# Patient Record
Sex: Female | Born: 1935 | Race: White | Hispanic: No | Smoking: Never smoker
Health system: Southern US, Community
[De-identification: ages and names within clinical notes are randomized; demographics above are authoritative.]

## PROBLEM LIST (undated history)

## (undated) DIAGNOSIS — K859 Acute pancreatitis without necrosis or infection, unspecified: Secondary | ICD-10-CM

## (undated) DIAGNOSIS — I639 Cerebral infarction, unspecified: Secondary | ICD-10-CM

## (undated) DIAGNOSIS — F0280 Dementia in other diseases classified elsewhere without behavioral disturbance: Secondary | ICD-10-CM

## (undated) DIAGNOSIS — F0150 Vascular dementia without behavioral disturbance: Secondary | ICD-10-CM

## (undated) DIAGNOSIS — G309 Alzheimer's disease, unspecified: Secondary | ICD-10-CM

## (undated) HISTORY — PX: APPENDECTOMY: SHX54

## (undated) HISTORY — PX: ABDOMINAL HYSTERECTOMY: SHX81

## (undated) HISTORY — PX: CHOLECYSTECTOMY: SHX55

---

## 2014-06-29 ENCOUNTER — Emergency Department: Payer: Self-pay | Admitting: Emergency Medicine

## 2014-06-29 LAB — COMPREHENSIVE METABOLIC PANEL
ALBUMIN: 3.8 g/dL (ref 3.4–5.0)
ALK PHOS: 115 U/L
ANION GAP: 11 (ref 7–16)
BILIRUBIN TOTAL: 0.4 mg/dL (ref 0.2–1.0)
BUN: 17 mg/dL (ref 7–18)
CO2: 21 mmol/L (ref 21–32)
Calcium, Total: 8.8 mg/dL (ref 8.5–10.1)
Chloride: 109 mmol/L — ABNORMAL HIGH (ref 98–107)
Creatinine: 1.44 mg/dL — ABNORMAL HIGH (ref 0.60–1.30)
EGFR (African American): 40 — ABNORMAL LOW
EGFR (Non-African Amer.): 35 — ABNORMAL LOW
Glucose: 110 mg/dL — ABNORMAL HIGH (ref 65–99)
Osmolality: 283 (ref 275–301)
Potassium: 3.6 mmol/L (ref 3.5–5.1)
SGOT(AST): 34 U/L (ref 15–37)
SGPT (ALT): 21 U/L
Sodium: 141 mmol/L (ref 136–145)
TOTAL PROTEIN: 7.4 g/dL (ref 6.4–8.2)

## 2014-06-29 LAB — CBC
HCT: 31.3 % — ABNORMAL LOW (ref 35.0–47.0)
HGB: 9.6 g/dL — AB (ref 12.0–16.0)
MCH: 21.6 pg — AB (ref 26.0–34.0)
MCHC: 30.8 g/dL — ABNORMAL LOW (ref 32.0–36.0)
MCV: 70 fL — ABNORMAL LOW (ref 80–100)
Platelet: 223 10*3/uL (ref 150–440)
RBC: 4.47 10*6/uL (ref 3.80–5.20)
RDW: 17.3 % — ABNORMAL HIGH (ref 11.5–14.5)
WBC: 7.2 10*3/uL (ref 3.6–11.0)

## 2014-06-29 LAB — TROPONIN I: Troponin-I: <0.02 ng/mL

## 2014-06-29 LAB — LIPASE, BLOOD: LIPASE: 122 U/L (ref 73–393)

## 2014-10-22 ENCOUNTER — Emergency Department: Payer: Self-pay | Admitting: Emergency Medicine

## 2014-11-26 ENCOUNTER — Emergency Department: Payer: Self-pay | Admitting: Emergency Medicine

## 2014-12-02 ENCOUNTER — Encounter (HOSPITAL_COMMUNITY): Payer: Self-pay | Admitting: Internal Medicine

## 2014-12-02 ENCOUNTER — Inpatient Hospital Stay (HOSPITAL_COMMUNITY): Payer: MEDICARE

## 2014-12-02 ENCOUNTER — Inpatient Hospital Stay (HOSPITAL_COMMUNITY)
Admission: EM | Admit: 2014-12-02 | Discharge: 2014-12-03 | DRG: 440 | Disposition: A | Discharge status: Nursing Home (Includes ICF) | Payer: MEDICARE | Source: Other Acute Inpatient Hospital | Attending: Internal Medicine | Admitting: Internal Medicine

## 2014-12-02 ENCOUNTER — Emergency Department: Payer: Self-pay | Admitting: Emergency Medicine

## 2014-12-02 DIAGNOSIS — Z8673 Personal history of transient ischemic attack (TIA), and cerebral infarction without residual deficits: Secondary | ICD-10-CM

## 2014-12-02 DIAGNOSIS — R748 Abnormal levels of other serum enzymes: Secondary | ICD-10-CM | POA: Diagnosis not present

## 2014-12-02 DIAGNOSIS — F0150 Vascular dementia without behavioral disturbance: Secondary | ICD-10-CM | POA: Diagnosis present

## 2014-12-02 DIAGNOSIS — Z91040 Latex allergy status: Secondary | ICD-10-CM | POA: Diagnosis not present

## 2014-12-02 DIAGNOSIS — F0390 Unspecified dementia without behavioral disturbance: Secondary | ICD-10-CM | POA: Diagnosis present

## 2014-12-02 DIAGNOSIS — I672 Cerebral atherosclerosis: Secondary | ICD-10-CM | POA: Diagnosis present

## 2014-12-02 DIAGNOSIS — K859 Acute pancreatitis without necrosis or infection, unspecified: Secondary | ICD-10-CM | POA: Diagnosis present

## 2014-12-02 DIAGNOSIS — F0391 Unspecified dementia with behavioral disturbance: Secondary | ICD-10-CM | POA: Diagnosis not present

## 2014-12-02 DIAGNOSIS — G309 Alzheimer's disease, unspecified: Secondary | ICD-10-CM | POA: Diagnosis present

## 2014-12-02 DIAGNOSIS — Z9049 Acquired absence of other specified parts of digestive tract: Secondary | ICD-10-CM | POA: Diagnosis not present

## 2014-12-02 DIAGNOSIS — R1013 Epigastric pain: Secondary | ICD-10-CM | POA: Diagnosis present

## 2014-12-02 DIAGNOSIS — F0280 Dementia in other diseases classified elsewhere without behavioral disturbance: Secondary | ICD-10-CM | POA: Diagnosis present

## 2014-12-02 DIAGNOSIS — K850 Idiopathic acute pancreatitis: Secondary | ICD-10-CM

## 2014-12-02 DIAGNOSIS — Z885 Allergy status to narcotic agent status: Secondary | ICD-10-CM

## 2014-12-02 HISTORY — DX: Alzheimer's disease, unspecified: G30.9

## 2014-12-02 HISTORY — DX: Acute pancreatitis without necrosis or infection, unspecified: K85.90

## 2014-12-02 HISTORY — DX: Vascular dementia, unspecified severity, without behavioral disturbance, psychotic disturbance, mood disturbance, and anxiety: F01.50

## 2014-12-02 HISTORY — DX: Cerebral infarction, unspecified: I63.9

## 2014-12-02 HISTORY — DX: Dementia in other diseases classified elsewhere, unspecified severity, without behavioral disturbance, psychotic disturbance, mood disturbance, and anxiety: F02.80

## 2014-12-02 LAB — LIPASE, BLOOD: LIPASE: 30 U/L (ref 11–59)

## 2014-12-02 MED ORDER — HEPARIN SODIUM (PORCINE) 5000 UNIT/ML IJ SOLN
5000.0000 [IU] | Freq: Three times a day (TID) | INTRAMUSCULAR | Status: DC
  Administered 2014-12-02 – 2014-12-03 (×3): 5000 [IU] via SUBCUTANEOUS
  Filled 2014-12-02 (×3): qty 1

## 2014-12-02 MED ORDER — SODIUM CHLORIDE 0.9 % IV SOLN
INTRAVENOUS | Status: DC
  Administered 2014-12-02: 1 mL via INTRAVENOUS

## 2014-12-02 MED ORDER — FERROUS SULFATE 325 (65 FE) MG PO TABS
325.0000 mg | ORAL_TABLET | Freq: Two times a day (BID) | ORAL | Status: DC
  Administered 2014-12-02 – 2014-12-03 (×3): 325 mg via ORAL
  Filled 2014-12-02 (×3): qty 1

## 2014-12-02 MED ORDER — DOCUSATE SODIUM 100 MG PO CAPS
100.0000 mg | ORAL_CAPSULE | Freq: Two times a day (BID) | ORAL | Status: DC
  Administered 2014-12-02 – 2014-12-03 (×3): 100 mg via ORAL
  Filled 2014-12-02 (×3): qty 1

## 2014-12-02 MED ORDER — ATORVASTATIN CALCIUM 20 MG PO TABS
20.0000 mg | ORAL_TABLET | Freq: Every day | ORAL | Status: DC
  Administered 2014-12-02: 20 mg via ORAL
  Filled 2014-12-02: qty 1

## 2014-12-02 MED ORDER — ONDANSETRON HCL 4 MG/2ML IJ SOLN: 4.0000 mg | Freq: Four times a day (QID) | INTRAMUSCULAR | Status: DC | PRN

## 2014-12-02 MED ORDER — MORPHINE SULFATE 2 MG/ML IJ SOLN: 2.0000 mg | INTRAMUSCULAR | Status: DC | PRN

## 2014-12-02 MED ORDER — METOPROLOL SUCCINATE ER 25 MG PO TB24
12.5000 mg | ORAL_TABLET | Freq: Every day | ORAL | Status: DC
  Administered 2014-12-02 – 2014-12-03 (×2): 12.5 mg via ORAL
  Filled 2014-12-02 (×2): qty 1

## 2014-12-02 MED ORDER — DONEPEZIL HCL 10 MG PO TABS
10.0000 mg | ORAL_TABLET | Freq: Every day | ORAL | Status: DC
  Administered 2014-12-02: 10 mg via ORAL
  Filled 2014-12-02: qty 1

## 2014-12-02 MED ORDER — SODIUM CHLORIDE 0.9 % IJ SOLN: 3.0000 mL | Freq: Two times a day (BID) | INTRAMUSCULAR | Status: DC

## 2014-12-02 MED ORDER — CLOPIDOGREL BISULFATE 75 MG PO TABS
75.0000 mg | ORAL_TABLET | Freq: Every day | ORAL | Status: DC
  Administered 2014-12-02 – 2014-12-03 (×2): 75 mg via ORAL
  Filled 2014-12-02 (×2): qty 1

## 2014-12-02 NOTE — H&P (Addendum)
[facility] History and Physical  Taylor Mack [id number]:[id number] DOB: 11-08-1933 DOA: 12/02/2014  Referring physician: EDP PCP: No primary care provider on file.   Chief Complaint: abdominal pain   HPI: Taylor RingsBessie L Hackley is a 81 y.o. female who presents [facility] the ED at [facility] with abdominal pain, N/V.  Symptoms onset earlier this evening.  Pain is severe, located in her epigastric area, she has tried nothing for symptoms at this time.  Seen 11/26/14 with similar symptoms but negative CT and work up at that time per report (lipase was 257 at that time which is WNL for [facility] labs).  Today work up reveals elevated lipase of over 1400.  Review of Systems: Systems reviewed.  As above, otherwise negative  Past Medical History  Diagnosis Date  . Alzheimer's dementia   . Stroke   . Vascular dementia    Past Surgical History  Procedure Laterality Date  . Cholecystectomy     Social History:  reports that she does not drink alcohol or use illicit drugs. Her tobacco history is not on file.  Allergies  Allergen Reactions  . Codeine   . Latex     No family history on file.   Prior to Admission medications   Not on File   Physical Exam: Filed Vitals:   12/02/14 0525  BP: 97/50  Pulse: 84  Temp: 98.8 F (37.1 C)  Resp: 17    BP 97/50 mmHg  Pulse 84  Temp(Src) 98.8 F (37.1 C) (Oral)  Resp 17  Ht 5\' 3"  (1.6 m)  Wt 72.077 kg (158 lb 14.4 oz)  BMI 28.16 kg/m2  SpO2 93%  General Appearance:    Alert, oriented, no distress, appears stated age  Head:    Normocephalic, atraumatic  Eyes:    PERRL, EOMI, sclera non-icteric        Nose:   Nares without drainage or epistaxis. Mucosa, turbinates normal  Throat:   Moist mucous membranes. Oropharynx without erythema or exudate.  Neck:   Supple. No carotid bruits.  No thyromegaly.  No lymphadenopathy.   Back:     No CVA tenderness, no spinal tenderness  Lungs:     Clear to auscultation bilaterally, without wheezes, rhonchi or rales  Chest  wall:    No tenderness to palpitation  Heart:    Regular rate and rhythm without murmurs, gallops, rubs  Abdomen:     Soft, Epigastric tenderness nondistended, normal bowel sounds, no organomegaly  Genitalia:    deferred  Rectal:    deferred  Extremities:   No clubbing, cyanosis or edema.  Pulses:   2+ and symmetric all extremities  Skin:   Skin color, texture, turgor normal, no rashes or lesions  Lymph nodes:   Cervical, supraclavicular, and axillary nodes normal  Neurologic:   CNII-XII intact. Normal strength, sensation and reflexes      throughout    Labs on Admission:  Basic Metabolic Panel: No results for input(s): NA, K, CL, CO2, GLUCOSE, BUN, CREATININE, CALCIUM, MG, PHOS in the last 168 hours. Liver Function Tests: No results for input(s): AST, ALT, ALKPHOS, BILITOT, PROT, ALBUMIN in the last 168 hours. No results for input(s): LIPASE, AMYLASE in the last 168 hours. No results for input(s): AMMONIA in the last 168 hours. CBC: No results for input(s): WBC, NEUTROABS, HGB, HCT, MCV, PLT in the last 168 hours. Cardiac Enzymes: No results for input(s): CKTOTAL, CKMB, CKMBINDEX, TROPONINI in the last 168 hours.  BNP (last 3 results) No results for  input(s): PROBNP in the last 8760 hours. CBG: No results for input(s): GLUCAP in the last 168 hours.  Radiological Exams on Admission: No results found.  EKG: Independently reviewed.  Assessment/Plan Principal Problem:   Pancreatitis, acute Active Problems:   Dementia   History of stroke   1. Acute pancreatitis - no EtOH use, idiopathic at this point 1. US abdomen ordered, has CBD dilation on CT scan but this may just be normal as she has had a cholecystectomy in the past, no obvious evidence of stone on CT scan. 2. IVF 3. NPO for bowel rest 4. Pain control with morphine, pain was controlled at [facility] after 0.5mg  dilaudid, patient now sleeping comfortably 5. Repeat CMP tomorrow 2. Dementia - continue home meds 3. H/o  stroke - continue plavix, statin 4. DVT ppx with heparin     Code Status: Full Code  Family Communication: Son at bedside Disposition Plan: Admit to inpatient   Time spent: 70 min  GARDNER, JARED M. [facility] Pager [phone]  If 7AM-7PM, please contact the day team taking care of the patient [email or web address] Password [facility] 12/02/2014, 6:22 AM

## 2014-12-02 NOTE — Progress Notes (Signed)
INITIAL NUTRITION ASSESSMENT  DOCUMENTATION CODES Per approved criteria  -Not Applicable   INTERVENTION: -RD to follow for diet advancement -Supplement diet as appropriate  NUTRITION DIAGNOSIS: Inadequate oral intake related to altered GI function as evidenced by NPO.   Goal: Pt will meet >90% of estimated nutritional needs  Monitor:  Diet advancement, PO intake, labs, weight changes, I/O's  Reason for Assessment: MST=2  81 y.o. female  Admitting Dx: Pancreatitis, acute  Taylor Mack is a 81 y.o. female who [facility] ED [facility] with abdominal pain, N/V. Symptoms onset earlier this evening. Pain is severe, located in her epigastric area, she has tried nothing for symptoms at this time. Seen 11/26/14 with similar symptoms but negative CT and work up at that time per report (lipase was 257 at that time which is WNL for [facility] labs). Today work up reveals elevated lipase of over 1400.  ASSESSMENT: Pt with dementia, so hx is likely unreliable. She reports good appetite and no weight loss PTA. She has no problems chewing or swallowing foods and liquids. She also denies no changes in her mobility PTA. Spoke with nursing student, who reports that pt has been trying to get out of bed.  Pt is currently NPO for bowel rest due to pancreatitis.  Nutrition-focused physical exam reveals no signs of fat or muscle depletion. No labs available to review at this time.   Height: Ht Readings from Last 1 Encounters:  12/02/14 5\' 3"  (1.6 m)    Weight: Wt Readings from Last 1 Encounters:  12/02/14 158 lb 14.4 oz (72.077 kg)    Ideal Body Weight: 115#  % Ideal Body Weight: 137%  Wt Readings from Last 10 Encounters:  12/02/14 158 lb 14.4 oz (72.077 kg)    Usual Body Weight: 158#  % Usual Body Weight: 100%  BMI:  Body mass index is 28.16 kg/(m^2).  Estimated Nutritional Needs: Kcal: 1600-1800 Protein: 65-75 grams Fluid: 1.6-1.8 L  Skin: WDL  Diet Order: Diet NPO time  specified Except for: Sips with Meds  EDUCATION NEEDS: -Education not appropriate at this time  No intake or output data in the 24 hours ending 12/02/14 1037  Last BM: 12/02/14  Labs:  No results for input(s): NA, K, CL, CO2, BUN, CREATININE, CALCIUM, MG, PHOS, GLUCOSE in the last 168 hours.  CBG (last 3)  No results for input(s): GLUCAP in the last 72 hours.  Scheduled Meds: . atorvastatin  20 mg Oral q1800  . clopidogrel  75 mg Oral Daily  . docusate sodium  100 mg Oral BID  . donepezil  10 mg Oral QHS  . ferrous sulfate  325 mg Oral BID WC  . heparin  5,000 Units Subcutaneous 3 times per day  . metoprolol succinate  12.5 mg Oral Daily  . sodium chloride  3 mL Intravenous Q12H    Continuous Infusions: . sodium chloride 1 mL (12/02/14 0532)    Past Medical History  Diagnosis Date  . Alzheimer's dementia   . Stroke   . Vascular dementia     Past Surgical History  Procedure Laterality Date  . Cholecystectomy      Taylor Mack A. Mayford KnifeWilliams, RD, LDN, CDE Pager: [phone] After hours Pager: [phone]

## 2014-12-02 NOTE — Clinical Social Work Psychosocial (Signed)
Clinical Social Work Department BRIEF PSYCHOSOCIAL ASSESSMENT 12/02/2014  Patient:  Taylor Mack,Taylor Mack     Account Number:  [id number]     Admit date:  12/02/2014  Clinical Social Worker:  Elouise MunroeANTERHAUS,Daisean Brodhead, LCSWA  Date/Time:  12/02/2014 04:47 PM  Referred by:  Physician  Date Referred:  12/02/2014 Referred for  ALF Placement   Other Referral:   Interview type:  Family Other interview type:    PSYCHOSOCIAL DATA Living Status:  FACILITY Admitted from facility:  Other Level of care:  Assisted Living Primary support name:  [name] Primary support relationship to patient:  FAMILY Degree of support available:   Patient lives in an ALF facility called [facility] in [place].  Staff provide care for patient, while son is the main contact due to patient's dementia.    CURRENT CONCERNS Current Concerns  Post-Acute Placement   Other Concerns:    SOCIAL WORK ASSESSMENT / PLAN Patient is a 81 year old female who lives at [facility] and is private pay.  Patient has dementia and is only oriented to person.  Contacted patient's son [name] to discuss placement after hospital.  Patient and son would like to return to the ALF and maybe eventually move to the memory care but currently patient's son says she is doing alright in the regular ALF.  Patient is confused at times, but able to converse when talked to if she wants to. Patient's son has been very pleased with the staff at the ALF and said CSW can contact Laverne if there are any other questions.  Patient's plan is to return to [facility] once she is medically ready and discharge orders have been received.   Assessment/plan status:  Psychosocial Support/Ongoing Assessment of Needs Other assessment/ plan:   Information/referral to community resources:    PATIENT'S/FAMILY'S RESPONSE TO PLAN OF CARE: Patient and son agreeable to returning to ALF once she is ready for discharge.   Ervin KnackEric R. Midori Dado, MSW,  Theresia MajorsLCSWA [phone] 12/02/2014 4:55 PM

## 2014-12-02 NOTE — Clinical Social Work Note (Addendum)
Received phone call from [facility] in [place] saying that patient is from Assisted Living and [facility]'s [facility].  Contacted patient's son to confirm where patient is living, and son [name] said she is in the ALF and not in memory care, but in the future she may move to memory care.  Currently the plan is to return to the ALF, and not the memory care unit, patient's son said Clide CliffLaverne is the contact person at [facility], and she is very helpful if there are any questions.  Ervin KnackEric R. Camree Wigington, MSW, LCSWA [phone] 12/02/2014 11:34 AM

## 2014-12-02 NOTE — Progress Notes (Signed)
[facility] Progress Note   Taylor RingsBessie L Dysart [id number] DOB: May 26, 1934 DOA: 12/02/2014 PCP: No primary care provider on file.  Brief narrative: Taylor Mack is a 81 y.o. female with dementia who [facility]. Her Lipase was noted to be > 1400 (upper limit of normal 300) and she was therefore admitted for acute pancreatitis. She was transferred to [facility].    Subjective: Patient confused (dementia) - no c/o pain or vomiting  Assessment/Plan: Principal Problem:   Pancreatitis, acute - lipase re-checked here and found to be normal- ultrasound abdomen does not show evidence of acute pancreatitis today - will start clears liquids and advance to full liquids today if she tolerates them  Active Problems:   Dementia - cont to monitor   Code Status: full code Family Communication:  Disposition Plan: home tomorrow if tolerating solids DVT prophylaxis: Heparin  Consultants:   Procedures:   Antibiotics: Anti-infectives    None         Objective: Filed Weights   12/02/14 0525  Weight: 72.077 kg (158 lb 14.4 oz)   No intake or output data in the 24 hours ending 12/02/14 1548   Vitals Filed Vitals:   12/02/14 0525 12/02/14 1014  BP: 97/50   Pulse: 84 65  Temp: 98.8 F (37.1 C)   TempSrc: Oral   Resp: 17   Height: 5\' 3"  (1.6 m)   Weight: 72.077 kg (158 lb 14.4 oz)   SpO2: 93%     Exam: General: alert, confused, No acute respiratory distress Lungs: Clear to auscultation bilaterally without wheezes or crackles Cardiovascular: Regular rate and rhythm without murmur gallop or rub normal S1 and S2 Abdomen: tender in periumbilical area, nondistended, soft, bowel sounds positive, no rebound, no ascites, no appreciable mass Extremities: No significant cyanosis, clubbing, or edema bilateral lower extremities  Data Reviewed: Basic Metabolic Panel: No results for input(s): NA, K, CL, CO2, GLUCOSE, BUN, CREATININE,  CALCIUM, MG, PHOS in the last 168 hours. Liver Function Tests: No results for input(s): AST, ALT, ALKPHOS, BILITOT, PROT, ALBUMIN in the last 168 hours.  Recent Labs Lab 12/02/14 1111  LIPASE 30   No results for input(s): AMMONIA in the last 168 hours. CBC: No results for input(s): WBC, NEUTROABS, HGB, HCT, MCV, PLT in the last 168 hours. Cardiac Enzymes: No results for input(s): CKTOTAL, CKMB, CKMBINDEX, TROPONINI in the last 168 hours. BNP (last 3 results) No results for input(s): BNP in the last 8760 hours.  ProBNP (last 3 results) No results for input(s): PROBNP in the last 8760 hours.  CBG: No results for input(s): GLUCAP in the last 168 hours.  No results found for this or any previous visit (from the past 240 hour(s)).   Studies:  Recent x-ray studies have been reviewed in detail by the Attending Physician  Scheduled Meds:  Scheduled Meds: . atorvastatin  20 mg Oral q1800  . clopidogrel  75 mg Oral Daily  . docusate sodium  100 mg Oral BID  . donepezil  10 mg Oral QHS  . ferrous sulfate  325 mg Oral BID WC  . heparin  5,000 Units Subcutaneous 3 times per day  . metoprolol succinate  12.5 mg Oral Daily  . sodium chloride  3 mL Intravenous Q12H   Continuous Infusions:   Time spent on care of this patient: 35 min   Herberth Deharo, MD 12/02/2014, 3:48 PM  LOS: 0 days   [facility] Office  [phone] Pager -  Text Page per [email or web address]  If 7PM-7AM, please contact night-coverage [email or web address]

## 2014-12-02 NOTE — Progress Notes (Signed)
New admit came from [facility] chief complaint of abd pain due to pancreatitis, alert and oriented with left AC PIV intact, accompanied by her son, started IVF with NSS at 125cc/hr, on NPO will continue to monitor, no s/s of nausea/vomiting and abd pain at this time.f

## 2014-12-03 ENCOUNTER — Encounter (HOSPITAL_COMMUNITY): Payer: Self-pay | Admitting: General Practice

## 2014-12-03 DIAGNOSIS — K859 Acute pancreatitis, unspecified: Principal | ICD-10-CM

## 2014-12-03 LAB — COMPREHENSIVE METABOLIC PANEL
ALT: 14 U/L (ref 0–35)
AST: 24 U/L (ref 0–37)
Albumin: 3.4 g/dL — ABNORMAL LOW (ref 3.5–5.2)
Alkaline Phosphatase: 78 U/L (ref 39–117)
Anion gap: 4 — ABNORMAL LOW (ref 5–15)
BILIRUBIN TOTAL: 0.8 mg/dL (ref 0.3–1.2)
BUN: 9 mg/dL (ref 6–23)
CO2: 29 mmol/L (ref 19–32)
CREATININE: 1.17 mg/dL — AB (ref 0.50–1.10)
Calcium: 8.7 mg/dL (ref 8.4–10.5)
Chloride: 106 mmol/L (ref 96–112)
GFR calc Af Amer: 50 mL/min — ABNORMAL LOW (ref >90)
GFR calc non Af Amer: 43 mL/min — ABNORMAL LOW (ref >90)
GLUCOSE: 96 mg/dL (ref 70–99)
POTASSIUM: 3.8 mmol/L (ref 3.5–5.1)
Sodium: 139 mmol/L (ref 135–145)
Total Protein: 6 g/dL (ref 6.0–8.3)

## 2014-12-03 LAB — CBC
HCT: 34.8 % — ABNORMAL LOW (ref 36.0–46.0)
Hemoglobin: 10.6 g/dL — ABNORMAL LOW (ref 12.0–15.0)
MCH: 24 pg — ABNORMAL LOW (ref 26.0–34.0)
MCHC: 30.5 g/dL (ref 30.0–36.0)
MCV: 78.7 fL (ref 78.0–100.0)
Platelets: 180 10*3/uL (ref 150–400)
RBC: 4.42 MIL/uL (ref 3.87–5.11)
RDW: 22.5 % — AB (ref 11.5–15.5)
WBC: 5.1 10*3/uL (ref 4.0–10.5)

## 2014-12-03 NOTE — Clinical Social Work Note (Signed)
Patient to be d/c'ed today to [facility] in [place], patient and family agreeable to plans will transport via patient's son RN to call report.  Patient's FL2 and discharge summary faxed to ALF.  Windell MouldingEric Habiba Treloar, MSW, Theresia MajorsLCSWA [phone]

## 2014-12-03 NOTE — Progress Notes (Signed)
Discharge to [facility] [place], transported by [employer]. Discharge packet given to Son, no questions verbalized.

## 2014-12-03 NOTE — Discharge Summary (Signed)
Physician Discharge Summary  Taylor Mack [id number] DOB: 1934-07-20 DOA: 12/02/2014  PCP: No primary care provider on file.  Admit date: 12/02/2014 Discharge date: 12/03/2014  Time spent: 45 minutes    Discharge Condition: Stable Diet recommendation: Heart healthy diet  Discharge Diagnoses:  Principal Problem:   Pancreatitis, acute Active Problems:   Dementia   History of stroke      History of present illness:  Taylor Mack is a 81 y.o. female with dementia who [facility], nausea and vomiting. Since the patient was confused it was difficult to obtain a history from her. Her Lipase was noted to be > 1400 (upper limit of normal 300) and therefore it was decided to admit her for for acute pancreatitis. She was [facility] to be [facility].    Hospital Course:  Principal Problem:  Pancreatitis, acute - lipase re-checked here on 2/22 and found to be normal- ultrasound abdomen on 2/22 does not show evidence of acute pancreatitis  - she was started clears liquids yesterday and advance to a solid diet- no recurrence of vomiting and no complaints of abdominal pain noted   Active Problems:  Dementia -She has significant memory deficits but has not had any significant behavioral disturbances - The patient lives in assisted living facility where she will be returning to today   Discharge Exam: Filed Weights   12/02/14 0525  Weight: 72.077 kg (158 lb 14.4 oz)   Filed Vitals:   12/03/14 0924  BP:   Pulse: 64  Temp:   Resp:     General: AAO x 3, no distress Cardiovascular: RRR, no murmurs  Respiratory: clear to auscultation bilaterally GI: soft, non-tender, non-distended, bowel sound positive  Discharge Instructions You were cared for by a hospitalist during your hospital stay. If you have any questions about your discharge medications or the care you received while you were in the hospital after you are  discharged, you can call the unit and asked to speak with the hospitalist on call if the hospitalist that took care of you is not available. Once you are discharged, your primary care physician will handle any further medical issues. Please note that NO REFILLS for any discharge medications will be authorized once you are discharged, as it is imperative that you return to your primary care physician (or establish a relationship with a primary care physician if you do not have one) for your aftercare needs so that they can reassess your need for medications and monitor your lab values.  Discharge Instructions    Diet - low sodium heart healthy    Complete by:  As directed      Increase activity slowly    Complete by:  As directed             Medication List    TAKE these medications        acetaminophen 500 MG tablet  Commonly known as:  TYLENOL  Take 500 mg by mouth every 4 (four) hours as needed for fever.     atorvastatin 20 MG tablet  Commonly known as:  LIPITOR  Take 20 mg by mouth daily.     BAZA PROTECT EX  Apply 1 application topically 3 (three) times daily as needed (incontinence).     calcium-vitamin D 500-200 MG-UNIT per tablet  Commonly known as:  OSCAL WITH D  Take 0.5 tablets by mouth daily.     clopidogrel 75 MG tablet  Commonly known as:  PLAVIX  Take 75 mg by mouth daily.     diclofenac sodium 1 % Gel  Commonly known as:  VOLTAREN  Apply 2 g topically 2 (two) times daily. For 14 days     docusate sodium 100 MG capsule  Commonly known as:  COLACE  Take 100 mg by mouth 2 (two) times daily.     donepezil 10 MG tablet  Commonly known as:  ARICEPT  Take 10 mg by mouth at bedtime.     escitalopram 20 MG tablet  Commonly known as:  LEXAPRO  Take 20 mg by mouth daily.     ferrous sulfate 325 (65 FE) MG tablet  Take 325 mg by mouth 2 (two) times daily with a meal.     guaifenesin 100 MG/5ML syrup  Commonly known as:  ROBITUSSIN  Take 200 mg by mouth 3  (three) times daily as needed for cough.     loperamide 2 MG capsule  Commonly known as:  IMODIUM  Take 2 mg by mouth every 3 (three) hours as needed for diarrhea or loose stools.     metoprolol succinate 25 MG 24 hr tablet  Commonly known as:  TOPROL-XL  Take 12.5 mg by mouth daily.     neomycin-bacitracin-polymyxin ointment  Commonly known as:  NEOSPORIN  Apply 1 application topically 2 (two) times daily as needed for wound care. apply to eye     NONFORMULARY OR COMPOUNDED ITEM  Apply 1 application topically 2 (two) times daily as needed (itching). Triamcinolone/Minerin 1:1 cream     omeprazole 20 MG capsule  Commonly known as:  PRILOSEC  Take 20 mg by mouth daily.     Vitamin D (Ergocalciferol) 50000 UNITS Caps capsule  Commonly known as:  DRISDOL  Take 50,000 Units by mouth every 7 (seven) days.       Allergies  Allergen Reactions  . Codeine Nausea And Vomiting  . Latex Other (See Comments)    unknown      The results of significant diagnostics from this hospitalization (including imaging, microbiology, ancillary and laboratory) are listed below for reference.    Significant Diagnostic Studies: US Abdomen Complete  12/02/2014   CLINICAL DATA:  Acute pancreatitis.  History of cholecystectomy.  EXAM: ULTRASOUND ABDOMEN COMPLETE  COMPARISON:  Abdominal CT 11/26/2014  FINDINGS: Gallbladder: Surgically removed.  Common bile duct: Diameter: 0.8 cm and stable from recent CT.  Liver: No focal lesion identified. Within normal limits in parenchymal echogenicity.  IVC: No abnormality visualized.  Pancreas: The pancreatic head and body are hyperechoic without significant duct dilatation. The pancreatic tail is not visualized.  Spleen: Size and appearance within normal limits.  Right Kidney: Length: 9.8 cm. Echogenicity within normal limits. No mass or hydronephrosis visualized.  Left Kidney: Length: 11.0 cm. Echogenicity within normal limits. No mass or hydronephrosis visualized.   Abdominal aorta: No aneurysm visualized.  Limited evaluation.  Other findings: None.  IMPRESSION: No acute abnormality.  Post cholecystectomy. Mild dilatation of the common bile duct is within normal limits for age and post cholecystectomy. No significant change from recent abdominal CT.   Electronically Signed   By: Richarda Overlie M.D.   On: 12/02/2014 10:37    Microbiology: No results found for this or any previous visit (from the past 240 hour(s)).   Labs: Basic Metabolic Panel:  Recent Labs Lab 12/03/14 0523  NA 139  K 3.8  CL 106  CO2 29  GLUCOSE 96  BUN 9  CREATININE  1.17*  CALCIUM 8.7   Liver Function Tests:  Recent Labs Lab 12/03/14 0523  AST 24  ALT 14  ALKPHOS 78  BILITOT 0.8  PROT 6.0  ALBUMIN 3.4*    Recent Labs Lab 12/02/14 1111  LIPASE 30   No results for input(s): AMMONIA in the last 168 hours. CBC:  Recent Labs Lab 12/03/14 0523  WBC 5.1  HGB 10.6*  HCT 34.8*  MCV 78.7  PLT 180   Cardiac Enzymes: No results for input(s): CKTOTAL, CKMB, CKMBINDEX, TROPONINI in the last 168 hours. BNP: BNP (last 3 results) No results for input(s): BNP in the last 8760 hours.  ProBNP (last 3 results) No results for input(s): PROBNP in the last 8760 hours.  CBG: No results for input(s): GLUCAP in the last 168 hours.     SignedCalvert Cantor, MD [facility] 12/03/2014, 11:55 AM

## 2014-12-03 NOTE — Care Management (Signed)
12-03-14 Important message from Medicare given to patient . Jceon Alverio RN BSN         

## 2015-09-13 ENCOUNTER — Emergency Department
Admission: EM | Admit: 2015-09-13 | Discharge: 2015-09-14 | Disposition: A | Discharge status: Home / Self Care | Payer: MEDICARE | Attending: Emergency Medicine | Admitting: Emergency Medicine

## 2015-09-13 ENCOUNTER — Encounter: Payer: Self-pay | Admitting: Emergency Medicine

## 2015-09-13 DIAGNOSIS — Z791 Long term (current) use of non-steroidal anti-inflammatories (NSAID): Secondary | ICD-10-CM | POA: Insufficient documentation

## 2015-09-13 DIAGNOSIS — G309 Alzheimer's disease, unspecified: Secondary | ICD-10-CM | POA: Diagnosis not present

## 2015-09-13 DIAGNOSIS — Z79899 Other long term (current) drug therapy: Secondary | ICD-10-CM | POA: Diagnosis not present

## 2015-09-13 DIAGNOSIS — J00 Acute nasopharyngitis [common cold]: Secondary | ICD-10-CM | POA: Diagnosis not present

## 2015-09-13 DIAGNOSIS — Z91040 Latex allergy status: Secondary | ICD-10-CM | POA: Insufficient documentation

## 2015-09-13 DIAGNOSIS — N390 Urinary tract infection, site not specified: Secondary | ICD-10-CM | POA: Insufficient documentation

## 2015-09-13 DIAGNOSIS — R451 Restlessness and agitation: Secondary | ICD-10-CM

## 2015-09-13 DIAGNOSIS — F0281 Dementia in other diseases classified elsewhere with behavioral disturbance: Secondary | ICD-10-CM | POA: Insufficient documentation

## 2015-09-13 DIAGNOSIS — F0391 Unspecified dementia with behavioral disturbance: Secondary | ICD-10-CM

## 2015-09-13 LAB — COMPREHENSIVE METABOLIC PANEL
ALT: 17 U/L (ref 14–54)
AST: 27 U/L (ref 15–41)
Albumin: 3.9 g/dL (ref 3.5–5.0)
Alkaline Phosphatase: 65 U/L (ref 38–126)
Anion gap: 5 (ref 5–15)
BUN: 12 mg/dL (ref 6–20)
CHLORIDE: 105 mmol/L (ref 101–111)
CO2: 28 mmol/L (ref 22–32)
CREATININE: 1.18 mg/dL — AB (ref 0.44–1.00)
Calcium: 9.3 mg/dL (ref 8.9–10.3)
GFR, EST AFRICAN AMERICAN: 49 mL/min — AB (ref >60)
GFR, EST NON AFRICAN AMERICAN: 43 mL/min — AB (ref >60)
Glucose, Bld: 98 mg/dL (ref 65–99)
POTASSIUM: 3.9 mmol/L (ref 3.5–5.1)
SODIUM: 138 mmol/L (ref 135–145)
TOTAL PROTEIN: 7.2 g/dL (ref 6.5–8.1)
Total Bilirubin: 0.4 mg/dL (ref 0.3–1.2)

## 2015-09-13 LAB — URINALYSIS COMPLETE WITH MICROSCOPIC (ARMC ONLY)
Bacteria, UA: NONE SEEN
Bilirubin Urine: NEGATIVE
Glucose, UA: NEGATIVE mg/dL
HGB URINE DIPSTICK: NEGATIVE
Ketones, ur: NEGATIVE mg/dL
Nitrite: NEGATIVE
PH: 5 (ref 5.0–8.0)
Protein, ur: NEGATIVE mg/dL
SPECIFIC GRAVITY, URINE: 1.009 (ref 1.005–1.030)

## 2015-09-13 LAB — CBC WITH DIFFERENTIAL/PLATELET
Basophils Absolute: 0 10*3/uL (ref 0–0.1)
Basophils Relative: 1 %
EOS ABS: 0.4 10*3/uL (ref 0–0.7)
EOS PCT: 8 %
HCT: 40.8 % (ref 35.0–47.0)
Hemoglobin: 13.8 g/dL (ref 12.0–16.0)
Lymphocytes Relative: 33 %
Lymphs Abs: 1.8 10*3/uL (ref 1.0–3.6)
MCH: 30.5 pg (ref 26.0–34.0)
MCHC: 33.8 g/dL (ref 32.0–36.0)
MCV: 90.3 fL (ref 80.0–100.0)
MONO ABS: 0.6 10*3/uL (ref 0.2–0.9)
Monocytes Relative: 11 %
Neutro Abs: 2.7 10*3/uL (ref 1.4–6.5)
Neutrophils Relative %: 47 %
PLATELETS: 177 10*3/uL (ref 150–440)
RBC: 4.52 MIL/uL (ref 3.80–5.20)
RDW: 12.8 % (ref 11.5–14.5)
WBC: 5.6 10*3/uL (ref 3.6–11.0)

## 2015-09-13 LAB — TROPONIN I: Troponin I: <0.03 ng/mL (ref <0.031)

## 2015-09-13 MED ORDER — CEPHALEXIN 500 MG PO CAPS
500.0000 mg | ORAL_CAPSULE | Freq: Once | ORAL | Status: AC
  Administered 2015-09-13: 500 mg via ORAL
  Filled 2015-09-13: qty 1

## 2015-09-13 MED ORDER — QUETIAPINE FUMARATE 25 MG PO TABS
25.0000 mg | ORAL_TABLET | Freq: Once | ORAL | Status: AC
  Administered 2015-09-13: 25 mg via ORAL
  Filled 2015-09-13: qty 1

## 2015-09-13 MED ORDER — CEPHALEXIN 500 MG PO CAPS: 500.0000 mg | ORAL_CAPSULE | Freq: Three times a day (TID) | ORAL | Status: DC

## 2015-09-13 MED ORDER — QUETIAPINE FUMARATE 25 MG PO TABS: 25.0000 mg | ORAL_TABLET | Freq: Every day | ORAL | Status: AC

## 2015-09-13 NOTE — Discharge Instructions (Signed)
Taylor Mack does have low-grade signs of a urinary tract infection. Give Keflex, 3 times a day, for 5 days. We have discussed her circumstance with the on-call psychiatrist. He is recommending Seroquel, 25 mg, once in the evening. If it is well-tolerated and helpful, it may be increased to twice a day. This should be done by Taylor Mack's primary physician or psychiatrist if possible. Return the emergency department if there are any further urgent medical concerns.  Dementia Dementia is a word that is used to describe problems with the brain and how it works. People with dementia have memory loss. They may also have problems with thinking, speaking, or solving problems. It can affect how they act around people, how they do their job, their mood, and their personality. These changes may not show up for a long time. Family or friends may not notice problems in the early part of this disease. HOME CARE The following tips are for the person living with, or caring for, the person with dementia. Make the home safe.  Remove locks on bathroom doors.  Use childproof locks on cabinets where alcohol, cleaning supplies, or chemicals are stored.  Put outlet covers in electrical outlets.  Put in childproof locks to keep doors and windows safe.  Remove stove knobs, or put in safety knobs that shut off on their own.  Lower the temperature on water heaters.  Label medicines. Lock them in a safe place.  Keep knives, lighters, matches, power tools, and guns out of reach or in a safe place.  Remove objects that might break or can hurt the person.  Make sure lighting is good inside and outside.  Put in grab bars if needed.  Use a device that detects falls or other needs for help. Lessen confusion.  Keep familiar objects and people around.  Use night lights or low lit (dim) lights at night.  Label objects or areas.  Use reminders, notes, or directions for daily activities or tasks.  Keep a simple  routine that is the same for waking, meals, bathing, dressing, and bedtime.  Create a calm and quiet home.  Put up clocks and calendars.  Keep emergency numbers and the home address near all phones.  Help show the different times of day. Open the curtains during the day to let light in. Speak clearly and directly.  Choose simple words and short sentences.  Use a gentle, calm voice.  Do not interrupt.  If the person has a hard time finding a word to use, give them the word or thought.  Ask 1 question at a time. Give enough time for the person to answer. Repeat the question if the person does not answer. Do things that lessen restlessness.  Provide a comfortable bed.  Have the same bedtime routine every night.  Have a regular walking and activity schedule.  Lessen naps during the day.  Do not let the person drink a lot of caffeine.  Go to events that are not overwhelming. Eat well and drink fluids.  Lessen distractions during meal times and snacks.  Avoid foods that are too hot or too cold.  Watch how the person chews and swallows. This is to make sure they do not choke. Other  Keep all vision, hearing, dental, and medical visits with the doctor.  Only give medicines as told by the doctor.  Watch the person's driving ability. Do not let the person drive if he or she cannot drive safely.  Use a program that helps  find a person if they become missing. You may need to register with this program. GET HELP RIGHT AWAY IF:   A fever of 102 F (38.9 C) develops.  Confusion develops or gets worse.  Sleepiness develops or gets worse.  Staying awake is hard to do.  New behavior problems start like mood swings, aggression, and seeing things that are not there.  Problems with balance, speech, or falling develop.  Problems swallowing develop.  Any problems of another sickness develop. MAKE SURE YOU:  Understand these instructions.  Will watch his or her  condition.  Will get help right away if he or she is not doing well or gets worse.   This information is not intended to replace advice given to you by your health care provider. Make sure you discuss any questions you have with your health care provider.   Document Released: 09/09/2008 Document Revised: 12/20/2011 Document Reviewed: 02/22/2011 Elsevier Interactive Patient Education [employer]

## 2015-09-13 NOTE — ED Notes (Signed)
Comes with sun from assisted living. Per report patient had been agitated today. Staff wanted her evaluated for bladder infection other other causes of increased. Per son perhaps 4 other incidents in the past few months. Per son patient has been in this facility, [facility], for past 7 months.

## 2015-09-13 NOTE — ED Notes (Signed)
Lab results reviewed
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
## 2015-09-16 LAB — URINE CULTURE

## 2015-12-03 ENCOUNTER — Emergency Department
Admission: EM | Admit: 2015-12-03 | Discharge: 2015-12-03 | Disposition: A | Discharge status: Home / Self Care | Payer: MEDICARE | Attending: Emergency Medicine | Admitting: Emergency Medicine

## 2015-12-03 DIAGNOSIS — Z79899 Other long term (current) drug therapy: Secondary | ICD-10-CM | POA: Insufficient documentation

## 2015-12-03 DIAGNOSIS — Z7902 Long term (current) use of antithrombotics/antiplatelets: Secondary | ICD-10-CM | POA: Insufficient documentation

## 2015-12-03 DIAGNOSIS — R451 Restlessness and agitation: Secondary | ICD-10-CM | POA: Insufficient documentation

## 2015-12-03 DIAGNOSIS — Z91040 Latex allergy status: Secondary | ICD-10-CM | POA: Insufficient documentation

## 2015-12-03 DIAGNOSIS — G309 Alzheimer's disease, unspecified: Secondary | ICD-10-CM | POA: Diagnosis not present

## 2015-12-03 DIAGNOSIS — R4182 Altered mental status, unspecified: Secondary | ICD-10-CM | POA: Diagnosis present

## 2015-12-03 DIAGNOSIS — F0281 Dementia in other diseases classified elsewhere with behavioral disturbance: Secondary | ICD-10-CM | POA: Insufficient documentation

## 2015-12-03 DIAGNOSIS — F0391 Unspecified dementia with behavioral disturbance: Secondary | ICD-10-CM

## 2015-12-03 LAB — COMPREHENSIVE METABOLIC PANEL
ALT: 28 U/L (ref 14–54)
AST: 36 U/L (ref 15–41)
Albumin: 3.9 g/dL (ref 3.5–5.0)
Alkaline Phosphatase: 54 U/L (ref 38–126)
Anion gap: 7 (ref 5–15)
BUN: 24 mg/dL — AB (ref 6–20)
CHLORIDE: 106 mmol/L (ref 101–111)
CO2: 25 mmol/L (ref 22–32)
Calcium: 9.2 mg/dL (ref 8.9–10.3)
Creatinine, Ser: 1.06 mg/dL — ABNORMAL HIGH (ref 0.44–1.00)
GFR calc Af Amer: 56 mL/min — ABNORMAL LOW (ref >60)
GFR calc non Af Amer: 48 mL/min — ABNORMAL LOW (ref >60)
Glucose, Bld: 106 mg/dL — ABNORMAL HIGH (ref 65–99)
POTASSIUM: 4.2 mmol/L (ref 3.5–5.1)
SODIUM: 138 mmol/L (ref 135–145)
Total Bilirubin: 0.8 mg/dL (ref 0.3–1.2)
Total Protein: 7.2 g/dL (ref 6.5–8.1)

## 2015-12-03 LAB — URINALYSIS COMPLETE WITH MICROSCOPIC (ARMC ONLY)
Bacteria, UA: NONE SEEN
Bilirubin Urine: NEGATIVE
GLUCOSE, UA: NEGATIVE mg/dL
HGB URINE DIPSTICK: NEGATIVE
Ketones, ur: NEGATIVE mg/dL
LEUKOCYTES UA: NEGATIVE
NITRITE: NEGATIVE
PH: 6 (ref 5.0–8.0)
PROTEIN: NEGATIVE mg/dL
SPECIFIC GRAVITY, URINE: 1.02 (ref 1.005–1.030)

## 2015-12-03 LAB — CBC
HCT: 41.5 % (ref 35.0–47.0)
Hemoglobin: 14.2 g/dL (ref 12.0–16.0)
MCH: 30.6 pg (ref 26.0–34.0)
MCHC: 34.2 g/dL (ref 32.0–36.0)
MCV: 89.6 fL (ref 80.0–100.0)
PLATELETS: 161 10*3/uL (ref 150–440)
RBC: 4.63 MIL/uL (ref 3.80–5.20)
RDW: 12.9 % (ref 11.5–14.5)
WBC: 5.1 10*3/uL (ref 3.6–11.0)

## 2015-12-03 LAB — GLUCOSE, CAPILLARY: GLUCOSE-CAPILLARY: 95 mg/dL (ref 65–99)

## 2015-12-03 NOTE — ED Notes (Signed)
Patient ambulated from triage to room 17, patient sent from [facility] with the complaint of patient being combative toward staff this morning. Patient did not have her Seroquel last PM because patient was lethargic. Per patient son patient is more confused than normal.

## 2015-12-03 NOTE — ED Notes (Signed)
Called [facility] and gave report to [place].

## 2015-12-03 NOTE — ED Notes (Signed)
Pt arrives to ER with son from [facility] via POV. Pt ambulatory, joking, pleasant. Pt sent here for be evaluated for AMS. Pt hx of vascular dementia. Pt believes that it is 2014, unknown to where she is, believes she is in [place] and lives there.

## 2015-12-03 NOTE — Discharge Instructions (Signed)

## 2015-12-03 NOTE — ED Provider Notes (Signed)
[facility] Emergency Department Provider Note  ____________________________________________  Time seen: 10:25 AM  I have reviewed the triage vital signs and the nursing notes.   HISTORY  Chief Complaint Altered Mental Status      HPI Taylor Mack is a 82 y.o. female with history of vascular dementia presents from [facility] memory care unit via private vehicle accompanied by her son. Per patient's son he was informed by Chip Boer and the patient should be taken to the emergency department for "an evaluation" as she is more confused than usual. Patient's son states that he was informed that the patient did not receive her cervical last night. Patient apparently was very insistent to the staff at [facility] this morning not wanting to cooperate in any way. Patient states that she does not like it there    Past Medical History  Diagnosis Date  . Alzheimer's dementia   . Stroke (HCC)   . Vascular dementia   . Pancreatitis     Patient Active Problem List   Diagnosis Date Noted  . Pancreatitis, acute 12/02/2014  . Dementia 12/02/2014  . History of stroke 12/02/2014  . Acute pancreatitis 12/02/2014    Past Surgical History  Procedure Laterality Date  . Cholecystectomy    . Appendectomy    . Abdominal hysterectomy      Current Outpatient Rx  Name  Route  Sig  Dispense  Refill  . atorvastatin (LIPITOR) 20 MG tablet   Oral   Take 20 mg by mouth daily.         . calcium-vitamin D (OSCAL WITH D) 500-200 MG-UNIT per tablet   Oral   Take 0.5 tablets by mouth daily.         . clopidogrel (PLAVIX) 75 MG tablet   Oral   Take 75 mg by mouth daily.         . divalproex (DEPAKOTE SPRINKLE) 125 MG capsule   Oral   Take 250 mg by mouth 3 (three) times daily.         Marland Kitchen docusate sodium (COLACE) 100 MG capsule   Oral   Take 100 mg by mouth 2 (two) times daily.         Marland Kitchen donepezil (ARICEPT) 10 MG tablet   Oral   Take 10 mg by mouth at  bedtime.         Marland Kitchen escitalopram (LEXAPRO) 20 MG tablet   Oral   Take 10 mg by mouth daily.          . ferrous sulfate 325 (65 FE) MG tablet   Oral   Take 325 mg by mouth 2 (two) times daily with a meal.         . metoprolol succinate (TOPROL-XL) 25 MG 24 hr tablet   Oral   Take 12.5 mg by mouth daily.         . pantoprazole (PROTONIX) 40 MG tablet   Oral   Take 40 mg by mouth daily.         . traZODone (DESYREL) 50 MG tablet   Oral   Take 25 mg by mouth at bedtime.         . Vitamin D, Ergocalciferol, (DRISDOL) 50000 UNITS CAPS capsule   Oral   Take 50,000 Units by mouth every 7 (seven) days.         . cephALEXin (KEFLEX) 500 MG capsule   Oral   Take 1 capsule (500 mg total) by mouth 3 (three) times daily.  Patient not taking: Reported on 12/03/2015   15 capsule   0   . QUEtiapine (SEROQUEL) 25 MG tablet   Oral   Take 1 tablet (25 mg total) by mouth at bedtime. Patient taking differently: Take 12.5 mg by mouth at bedtime.    7 tablet   0     Allergies Codeine and Latex  No family history on file.  Social History Social History  Substance Use Topics  . Smoking status: Never Smoker   . Smokeless tobacco: Never Used  . Alcohol Use: No    Review of Systems  Constitutional: Negative for fever. Eyes: Negative for visual changes. ENT: Negative for sore throat. Cardiovascular: Negative for chest pain. Respiratory: Negative for shortness of breath. Gastrointestinal: Negative for abdominal pain, vomiting and diarrhea. Genitourinary: Negative for dysuria. Musculoskeletal: Negative for back pain. Skin: Negative for rash. Neurological: Negative for headaches, focal weakness or numbness. Psychiatric: Increased agitation  10-point ROS otherwise negative.  ____________________________________________   PHYSICAL EXAM:  VITAL SIGNS: ED Triage Vitals  Enc Vitals Group     BP 12/03/15 0948 139/78 mmHg     Pulse Rate 12/03/15 0948 68     Resp  12/03/15 0948 20     Temp 12/03/15 0948 97.6 F (36.4 C)     Temp Source 12/03/15 0948 Oral     SpO2 12/03/15 0948 96 %     Weight 12/03/15 0948 150 lb (68.04 kg)     Height 12/03/15 0948 5\' 4"  (1.626 m)     Head Cir --      Peak Flow --      Pain Score --      Pain Loc --      Pain Edu? --      Excl. in GC? --     Constitutional: Alert but confused Eyes: Conjunctivae are normal. PERRL. Normal extraocular movements. ENT   Head: Normocephalic and atraumatic.   Nose: No congestion/rhinnorhea.   Mouth/Throat: Mucous membranes are moist.   Neck: No stridor. Hematological/Lymphatic/Immunilogical: No cervical lymphadenopathy. Cardiovascular: Normal rate, regular rhythm. Normal and symmetric distal pulses are present in all extremities. No murmurs, rubs, or gallops. Respiratory: Normal respiratory effort without tachypnea nor retractions. Breath sounds are clear and equal bilaterally. No wheezes/rales/rhonchi. Gastrointestinal: Soft and nontender. No distention. There is no CVA tenderness. Genitourinary: deferred Musculoskeletal: Nontender with normal range of motion in all extremities. No joint effusions.  No lower extremity tenderness nor edema. Neurologic:  Normal speech and language. No gross focal neurologic deficits are appreciated. Speech is normal.  Skin:  Skin is warm, dry and intact. No rash noted. Psychiatric: Mood and affect are normal. Speech and behavior are normal. Patient exhibits appropriate insight and judgment.  ____________________________________________    LABS (pertinent positives/negatives)  Labs Reviewed  URINALYSIS COMPLETEWITH MICROSCOPIC ([facility] ONLY) - Abnormal; Notable for the following:    Color, Urine YELLOW (*)    APPearance CLEAR (*)    Squamous Epithelial / LPF 0-5 (*)    All other components within normal limits  COMPREHENSIVE METABOLIC PANEL - Abnormal; Notable for the following:    Glucose, Bld 106 (*)    BUN 24 (*)     Creatinine, Ser 1.06 (*)    GFR calc non Af Amer 48 (*)    GFR calc Af Amer 56 (*)    All other components within normal limits  CBC  GLUCOSE, CAPILLARY        INITIAL IMPRESSION / ASSESSMENT AND PLAN / ED COURSE  Pertinent labs & imaging results that were available during my care of the patient were reviewed by me and considered in my medical decision making (see chart for details).  Lab data unremarkable including urine. Patient was in the emergency department with no violent outburst or aggressive behavior.   ____________________________________________   FINAL CLINICAL IMPRESSION(S) / ED DIAGNOSES  Final diagnoses:  Dementia, with behavioral disturbance      Darci Current, MD 12/03/15 1147

## 2016-07-22 ENCOUNTER — Encounter: Payer: Self-pay | Admitting: Emergency Medicine

## 2016-07-22 ENCOUNTER — Emergency Department: Payer: MEDICARE

## 2016-07-22 ENCOUNTER — Emergency Department
Admission: EM | Admit: 2016-07-22 | Discharge: 2016-07-22 | Disposition: A | Discharge status: Home / Self Care | Payer: MEDICARE | Attending: Student in an Organized Health Care Education/Training Program | Admitting: Student in an Organized Health Care Education/Training Program

## 2016-07-22 DIAGNOSIS — W010XXA Fall on same level from slipping, tripping and stumbling without subsequent striking against object, initial encounter: Secondary | ICD-10-CM | POA: Insufficient documentation

## 2016-07-22 DIAGNOSIS — Y9301 Activity, walking, marching and hiking: Secondary | ICD-10-CM | POA: Insufficient documentation

## 2016-07-22 DIAGNOSIS — Y9289 Other specified places as the place of occurrence of the external cause: Secondary | ICD-10-CM | POA: Diagnosis not present

## 2016-07-22 DIAGNOSIS — S6992XA Unspecified injury of left wrist, hand and finger(s), initial encounter: Secondary | ICD-10-CM | POA: Diagnosis present

## 2016-07-22 DIAGNOSIS — S63283A Dislocation of proximal interphalangeal joint of left middle finger, initial encounter: Secondary | ICD-10-CM | POA: Insufficient documentation

## 2016-07-22 DIAGNOSIS — Z79899 Other long term (current) drug therapy: Secondary | ICD-10-CM | POA: Insufficient documentation

## 2016-07-22 DIAGNOSIS — Z91040 Latex allergy status: Secondary | ICD-10-CM | POA: Diagnosis not present

## 2016-07-22 DIAGNOSIS — G309 Alzheimer's disease, unspecified: Secondary | ICD-10-CM | POA: Diagnosis not present

## 2016-07-22 DIAGNOSIS — W19XXXA Unspecified fall, initial encounter: Secondary | ICD-10-CM

## 2016-07-22 DIAGNOSIS — S62327A Displaced fracture of shaft of fifth metacarpal bone, left hand, initial encounter for closed fracture: Secondary | ICD-10-CM | POA: Diagnosis not present

## 2016-07-22 DIAGNOSIS — Y999 Unspecified external cause status: Secondary | ICD-10-CM | POA: Insufficient documentation

## 2016-07-22 DIAGNOSIS — R52 Pain, unspecified: Secondary | ICD-10-CM

## 2016-07-22 DIAGNOSIS — IMO0001 Reserved for inherently not codable concepts without codable children: Secondary | ICD-10-CM

## 2016-07-22 MED ORDER — BUPIVACAINE HCL 0.25 % IJ SOLN: 30.0000 mL | Freq: Once | INTRAMUSCULAR | Status: DC

## 2016-07-22 MED ORDER — ACETAMINOPHEN 500 MG PO TABS
1000.0000 mg | ORAL_TABLET | Freq: Once | ORAL | Status: AC
  Administered 2016-07-22: 1000 mg via ORAL
  Filled 2016-07-22: qty 2

## 2016-07-22 MED ORDER — BUPIVACAINE HCL (PF) 0.25 % IJ SOLN
INTRAMUSCULAR | Status: AC
  Filled 2016-07-22: qty 30

## 2016-07-22 NOTE — ED Provider Notes (Signed)
[facility] Emergency Department Provider Note    First MD Initiated Contact with Patient 07/22/16 1658     (approximate)  I have reviewed the triage vital signs and the nursing notes.   HISTORY  Chief Complaint Fall    HPI Taylor Mack is a 82 y.o. female  with a fall on an outstretched hand. Patient coming from Dr. Shelle [facility] care and has a history of dementia. Patient states that she was walking to the restroom and tripped over carpet. Denies LOC, denies hitting her head. Denies any headache, neck pain, chest pain, abdominal pain. She is able to ambulate after the fall. Is complaining of left hand pain as well as left wrist and left shoulder pain. She does take Plavix. Denies any fevers.   Past Medical History:  Diagnosis Date  . Alzheimer's dementia   . Pancreatitis   . Stroke (HCC)   . Vascular dementia     Patient Active Problem List   Diagnosis Date Noted  . Pancreatitis, acute 12/02/2014  . Dementia 12/02/2014  . History of stroke 12/02/2014  . Acute pancreatitis 12/02/2014    Past Surgical History:  Procedure Laterality Date  . ABDOMINAL HYSTERECTOMY    . APPENDECTOMY    . CHOLECYSTECTOMY      Prior to Admission medications   Medication Sig Start Date End Date Taking? Authorizing Provider  atorvastatin (LIPITOR) 20 MG tablet Take 20 mg by mouth daily.    Historical Provider, MD  calcium-vitamin D (OSCAL WITH D) 500-200 MG-UNIT per tablet Take 0.5 tablets by mouth daily.    Historical Provider, MD  cephALEXin (KEFLEX) 500 MG capsule Take 1 capsule (500 mg total) by mouth 3 (three) times daily. Patient not taking: Reported on 12/03/2015 09/13/15   [name], MD  clopidogrel (PLAVIX) 75 MG tablet Take 75 mg by mouth daily.    Historical Provider, MD  divalproex (DEPAKOTE SPRINKLE) 125 MG capsule Take 250 mg by mouth 3 (three) times daily.    Historical Provider, MD  docusate sodium (COLACE) 100 MG capsule Take 100 mg by mouth 2  (two) times daily.    Historical Provider, MD  donepezil (ARICEPT) 10 MG tablet Take 10 mg by mouth at bedtime.    Historical Provider, MD  escitalopram (LEXAPRO) 20 MG tablet Take 10 mg by mouth daily.     Historical Provider, MD  ferrous sulfate 325 (65 FE) MG tablet Take 325 mg by mouth 2 (two) times daily with a meal.    Historical Provider, MD  metoprolol succinate (TOPROL-XL) 25 MG 24 hr tablet Take 12.5 mg by mouth daily.    Historical Provider, MD  pantoprazole (PROTONIX) 40 MG tablet Take 40 mg by mouth daily.    Historical Provider, MD  QUEtiapine (SEROQUEL) 25 MG tablet Take 1 tablet (25 mg total) by mouth at bedtime. Patient taking differently: Take 12.5 mg by mouth at bedtime.  09/13/15   [name], MD  traZODone (DESYREL) 50 MG tablet Take 25 mg by mouth at bedtime.    Historical Provider, MD  Vitamin D, Ergocalciferol, (DRISDOL) 50000 UNITS CAPS capsule Take 50,000 Units by mouth every 7 (seven) days.    Historical Provider, MD    Allergies Codeine and Latex  History reviewed. No pertinent family history.  Social History Social History  Substance Use Topics  . Smoking status: Never Smoker  . Smokeless tobacco: Never Used  . Alcohol use No    Review of Systems Patient denies headaches,  rhinorrhea, blurry vision, numbness, shortness of breath, chest pain, edema, cough, abdominal pain, nausea, vomiting, diarrhea, dysuria, fevers, rashes or hallucinations unless otherwise stated above in HPI. ____________________________________________   PHYSICAL EXAM:  VITAL SIGNS: Vitals:   07/22/16 1701  BP: (!) 152/81  Pulse: (!) 54  Resp: 16  Temp: 97.7 F (36.5 C)    Constitutional: Alert and oriented. Well appearing and in no acute distress. Eyes: Conjunctivae are normal. PERRL. EOMI. Head: Atraumatic. Nose: No congestion/rhinnorhea. Mouth/Throat: Mucous membranes are moist.  Oropharynx non-erythematous. Neck: No stridor. Painless ROM. No cervical spine  tenderness to palpation Hematological/Lymphatic/Immunilogical: No cervical lymphadenopathy. Cardiovascular: Normal rate, regular rhythm. Grossly normal heart sounds.  Good peripheral circulation. Respiratory: Normal respiratory effort.  No retractions. Lungs CTAB. Gastrointestinal: Soft and nontender. No distention. No abdominal bruits. No CVA tenderness. Genitourinary:  Musculoskeletal: No lower extremity tenderness nor edema.  No joint effusions.  Obvious deformity and swelling to PIP of left 3rd digit,  Swelling and ttp of left 5th metacarpal.  ttp of left shoulder.  PMS intact distally. Neurologic:  CN- intact.  No facial droop, Normal FNF.  Normal heel to shin.  Sensation intact bilaterally. Normal speech and language. No gross focal neurologic deficits are appreciated. No gait instability.  Skin:  Skin is warm, dry and intact. No rash noted. Psychiatric: Mood and affect are normal. Speech and behavior are normal.  ____________________________________________   LABS (all labs ordered are listed, but only abnormal results are displayed)  No results found for this or any previous visit (from the past 24 hour(s)). ____________________________________________ ____________________________________________  RADIOLOGY  I personally reviewed all radiographic images ordered to evaluate for the above acute complaints and reviewed radiology reports and findings.  These findings were personally discussed with the patient.  Please see medical record for radiology report. ____________________________________________   PROCEDURES  Procedure(s) performed:yes ORTHOPEDIC INJURY TREATMENT Date/Time: 07/22/2016 7:19 PM Performed by: Willy EddyOBINSON, Tracia Lacomb Authorized by: Willy EddyOBINSON, Nghia Mcentee  Consent: Verbal consent obtained. Risks and benefits: risks, benefits and alternatives were discussed Consent given by: patient Injury location: hand Location details: left hand Injury type: fracture (5th  Metacarpal fracture with dislocation of PIP joint of 3rd digit) Fracture type: fifth metacarpal Pre-procedure neurovascular assessment: neurovascularly intact Pre-procedure distal perfusion: normal Pre-procedure neurological function: normal Pre-procedure range of motion: reduced  Anesthesia: Local anesthesia used: yes Local Anesthetic: bupivacaine 0.5% without epinephrine Manipulation performed: yes Skin traction used: yes Reduction successful: yes X-ray confirmed reduction: return of normal anatomy and full passive range of motion. Immobilization: splint Post-procedure neurovascular assessment: post-procedure neurovascularly intact Post-procedure distal perfusion: normal Post-procedure neurological function: normal Post-procedure range of motion: normal Patient tolerance: Patient tolerated the procedure well with no immediate complications    4    Critical Care performed: no ____________________________________________   INITIAL IMPRESSION / ASSESSMENT AND PLAN / ED COURSE  Pertinent labs & imaging results that were available during my care of the patient were reviewed by me and considered in my medical decision making (see chart for details).  DDX: fracture dislocation, contusion, sdh, sah  Taylor Mack is a 82 y.o. who presents to the ED with injuries as described above. Patient has good description mechanical fall. CT imaging ordered to evaluate for acute intracranial abnormality is a patient is on Plavix shows no evidence of acute dramatic injury. X-ray imaging ordered to evaluate for elbow fracture versus dislocation shows evidence of acute dislocation of the PIP joint of the third digit of the left hand with associated spiral fracture of the  fifth metacarpal. No rotational deformity. Plan local anesthesia and reduction.  Clinical Course  Comment By Time  Third digit reduced successfully. Patient with improvement in mobility of that digit. Patient's positive as  above. Otherwise asymptomatic and hemodynamic stable. Do not feel further diagnostic tests currently indicated at this time. Patient stable for discharge back to facility. Willy Eddy, MD 10/12 1916     ____________________________________________   FINAL CLINICAL IMPRESSION(S) / ED DIAGNOSES  Final diagnoses:  Fall, initial encounter  Closed dislocation finger, proximal interphalangeal joint, traumatic, initial encounter  Closed displaced fracture of shaft of fifth metacarpal bone of left hand, initial encounter      NEW MEDICATIONS STARTED DURING THIS VISIT:  New Prescriptions   No medications on file     Note:  This document was prepared using Dragon voice recognition software and may include unintentional dictation errors.    Willy Eddy, MD 07/22/16 [phone]

## 2016-07-22 NOTE — ED Triage Notes (Signed)
Pt to ED via EMS from [facility] c/o mechanical fall today that was unwitnessed.  Pt states tripped on carpet after going to the restroom, denies LOC, denies hitting head.  Pt having pain to left middle finger, left wrist radiating to left elbow.  Pt with obvious swelling and deformity to left middle finger.  EMS vitals 147/100, 57 HR, 100% RA.  Pt presents alert to self and situation, disoriented to time, chest rise even and unlabored, and in NAD at this time.

## 2016-09-22 ENCOUNTER — Emergency Department: Payer: MEDICARE

## 2016-09-22 ENCOUNTER — Encounter: Payer: Self-pay | Admitting: Emergency Medicine

## 2016-09-22 ENCOUNTER — Inpatient Hospital Stay
Admission: EM | Admit: 2016-09-22 | Discharge: 2016-09-25 | DRG: 392 | Disposition: A | Discharge status: Skilled Nursing Facility | Payer: MEDICARE | Attending: Surgery | Admitting: Surgery

## 2016-09-22 DIAGNOSIS — F02818 Dementia in other diseases classified elsewhere, unspecified severity, with other behavioral disturbance: Secondary | ICD-10-CM

## 2016-09-22 DIAGNOSIS — R262 Difficulty in walking, not elsewhere classified: Secondary | ICD-10-CM

## 2016-09-22 DIAGNOSIS — F0281 Dementia in other diseases classified elsewhere with behavioral disturbance: Secondary | ICD-10-CM | POA: Diagnosis present

## 2016-09-22 DIAGNOSIS — F0150 Vascular dementia without behavioral disturbance: Secondary | ICD-10-CM | POA: Diagnosis present

## 2016-09-22 DIAGNOSIS — Z79899 Other long term (current) drug therapy: Secondary | ICD-10-CM | POA: Diagnosis not present

## 2016-09-22 DIAGNOSIS — G308 Other Alzheimer's disease: Secondary | ICD-10-CM

## 2016-09-22 DIAGNOSIS — Z8673 Personal history of transient ischemic attack (TIA), and cerebral infarction without residual deficits: Secondary | ICD-10-CM

## 2016-09-22 DIAGNOSIS — N390 Urinary tract infection, site not specified: Secondary | ICD-10-CM

## 2016-09-22 DIAGNOSIS — K5720 Diverticulitis of large intestine with perforation and abscess without bleeding: Secondary | ICD-10-CM | POA: Diagnosis present

## 2016-09-22 DIAGNOSIS — G309 Alzheimer's disease, unspecified: Secondary | ICD-10-CM | POA: Diagnosis present

## 2016-09-22 LAB — CBC WITH DIFFERENTIAL/PLATELET
Basophils Absolute: 0.1 10*3/uL (ref 0–0.1)
Basophils Relative: 1 %
EOS PCT: 1 %
Eosinophils Absolute: 0.1 10*3/uL (ref 0–0.7)
HEMATOCRIT: 40.7 % (ref 35.0–47.0)
HEMOGLOBIN: 14.2 g/dL (ref 12.0–16.0)
LYMPHS ABS: 1.3 10*3/uL (ref 1.0–3.6)
LYMPHS PCT: 12 %
MCH: 31 pg (ref 26.0–34.0)
MCHC: 34.9 g/dL (ref 32.0–36.0)
MCV: 88.9 fL (ref 80.0–100.0)
Monocytes Absolute: 1.5 10*3/uL — ABNORMAL HIGH (ref 0.2–0.9)
Monocytes Relative: 14 %
NEUTROS PCT: 72 %
Neutro Abs: 7.8 10*3/uL — ABNORMAL HIGH (ref 1.4–6.5)
Platelets: 149 10*3/uL — ABNORMAL LOW (ref 150–440)
RBC: 4.58 MIL/uL (ref 3.80–5.20)
RDW: 13.3 % (ref 11.5–14.5)
WBC: 10.8 10*3/uL (ref 3.6–11.0)

## 2016-09-22 LAB — URINALYSIS, COMPLETE (UACMP) WITH MICROSCOPIC
Bilirubin Urine: NEGATIVE
GLUCOSE, UA: NEGATIVE mg/dL
KETONES UR: NEGATIVE mg/dL
NITRITE: NEGATIVE
PROTEIN: NEGATIVE mg/dL
Specific Gravity, Urine: 1.017 (ref 1.005–1.030)
pH: 7 (ref 5.0–8.0)

## 2016-09-22 LAB — COMPREHENSIVE METABOLIC PANEL
ALT: 13 U/L — ABNORMAL LOW (ref 14–54)
AST: 22 U/L (ref 15–41)
Albumin: 3.7 g/dL (ref 3.5–5.0)
Alkaline Phosphatase: 62 U/L (ref 38–126)
Anion gap: 8 (ref 5–15)
BUN: 14 mg/dL (ref 6–20)
CHLORIDE: 101 mmol/L (ref 101–111)
CO2: 27 mmol/L (ref 22–32)
Calcium: 9.3 mg/dL (ref 8.9–10.3)
Creatinine, Ser: 0.97 mg/dL (ref 0.44–1.00)
GFR calc Af Amer: >60 mL/min (ref >60)
GFR, EST NON AFRICAN AMERICAN: 54 mL/min — AB (ref >60)
Glucose, Bld: 111 mg/dL — ABNORMAL HIGH (ref 65–99)
POTASSIUM: 3.7 mmol/L (ref 3.5–5.1)
Sodium: 136 mmol/L (ref 135–145)
Total Bilirubin: 1.2 mg/dL (ref 0.3–1.2)
Total Protein: 7.3 g/dL (ref 6.5–8.1)

## 2016-09-22 LAB — PROTIME-INR
INR: 1.02
Prothrombin Time: 13.4 seconds (ref 11.4–15.2)

## 2016-09-22 LAB — LIPASE, BLOOD: Lipase: 18 U/L (ref 11–51)

## 2016-09-22 LAB — MRSA PCR SCREENING: MRSA BY PCR: POSITIVE — AB

## 2016-09-22 MED ORDER — SODIUM CHLORIDE 0.9 % IV BOLUS (SEPSIS)
500.0000 mL | Freq: Once | INTRAVENOUS | Status: AC
  Administered 2016-09-22: 500 mL via INTRAVENOUS

## 2016-09-22 MED ORDER — IOPAMIDOL (ISOVUE-300) INJECTION 61%
100.0000 mL | Freq: Once | INTRAVENOUS | Status: AC | PRN
  Administered 2016-09-22: 100 mL via INTRAVENOUS

## 2016-09-22 MED ORDER — IOPAMIDOL (ISOVUE-300) INJECTION 61%
30.0000 mL | Freq: Once | INTRAVENOUS | Status: AC | PRN
  Administered 2016-09-22: 30 mL via ORAL

## 2016-09-22 MED ORDER — MELATONIN 5 MG PO TABS
1.0000 | ORAL_TABLET | Freq: Every day | ORAL | Status: DC
  Administered 2016-09-22 – 2016-09-24 (×3): 5 mg via ORAL
  Filled 2016-09-22 (×4): qty 1

## 2016-09-22 MED ORDER — DIVALPROEX SODIUM 125 MG PO CSDR
250.0000 mg | DELAYED_RELEASE_CAPSULE | Freq: Three times a day (TID) | ORAL | Status: DC
  Administered 2016-09-22 – 2016-09-25 (×8): 250 mg via ORAL
  Filled 2016-09-22 (×8): qty 2

## 2016-09-22 MED ORDER — ONDANSETRON HCL 4 MG/2ML IJ SOLN: 4.0000 mg | Freq: Four times a day (QID) | INTRAMUSCULAR | Status: DC | PRN

## 2016-09-22 MED ORDER — QUETIAPINE FUMARATE 25 MG PO TABS
25.0000 mg | ORAL_TABLET | Freq: Every day | ORAL | Status: DC
  Administered 2016-09-22 – 2016-09-24 (×3): 25 mg via ORAL
  Filled 2016-09-22 (×3): qty 1

## 2016-09-22 MED ORDER — ACETAMINOPHEN 325 MG PO TABS
650.0000 mg | ORAL_TABLET | Freq: Four times a day (QID) | ORAL | Status: DC | PRN
  Administered 2016-09-22: 650 mg via ORAL
  Filled 2016-09-22: qty 2

## 2016-09-22 MED ORDER — ENOXAPARIN SODIUM 40 MG/0.4ML ~~LOC~~ SOLN
40.0000 mg | SUBCUTANEOUS | Status: DC
  Administered 2016-09-22 – 2016-09-24 (×2): 40 mg via SUBCUTANEOUS
  Filled 2016-09-22 (×3): qty 0.4

## 2016-09-22 MED ORDER — METOPROLOL SUCCINATE 12.5 MG HALF TABLET
12.5000 mg | ORAL_TABLET | Freq: Every day | ORAL | Status: DC
  Filled 2016-09-22 (×3): qty 1

## 2016-09-22 MED ORDER — SERTRALINE HCL 50 MG PO TABS
50.0000 mg | ORAL_TABLET | Freq: Every day | ORAL | Status: DC
  Administered 2016-09-22 – 2016-09-24 (×3): 50 mg via ORAL
  Filled 2016-09-22 (×3): qty 1

## 2016-09-22 MED ORDER — ACETAMINOPHEN 650 MG RE SUPP: 650.0000 mg | Freq: Four times a day (QID) | RECTAL | Status: DC | PRN

## 2016-09-22 MED ORDER — DONEPEZIL HCL 5 MG PO TABS
10.0000 mg | ORAL_TABLET | Freq: Every day | ORAL | Status: DC
  Administered 2016-09-22 – 2016-09-24 (×3): 10 mg via ORAL
  Filled 2016-09-22 (×3): qty 2

## 2016-09-22 MED ORDER — ATORVASTATIN CALCIUM 20 MG PO TABS
20.0000 mg | ORAL_TABLET | Freq: Every day | ORAL | Status: DC
  Administered 2016-09-22 – 2016-09-25 (×4): 20 mg via ORAL
  Filled 2016-09-22 (×4): qty 1

## 2016-09-22 MED ORDER — CALCIUM CARBONATE-VITAMIN D 500-200 MG-UNIT PO TABS
0.5000 | ORAL_TABLET | Freq: Every day | ORAL | Status: DC
  Administered 2016-09-23 – 2016-09-25 (×3): 0.5 via ORAL
  Filled 2016-09-22 (×3): qty 1

## 2016-09-22 MED ORDER — PIPERACILLIN-TAZOBACTAM 3.375 G IVPB 30 MIN
3.3750 g | Freq: Once | INTRAVENOUS | Status: AC
  Administered 2016-09-22: 3.375 g via INTRAVENOUS
  Filled 2016-09-22: qty 50

## 2016-09-22 MED ORDER — KCL IN DEXTROSE-NACL 20-5-0.45 MEQ/L-%-% IV SOLN
INTRAVENOUS | Status: DC
  Administered 2016-09-22 – 2016-09-23 (×2): via INTRAVENOUS
  Filled 2016-09-22 (×3): qty 1000

## 2016-09-22 MED ORDER — PIPERACILLIN-TAZOBACTAM 3.375 G IVPB
3.3750 g | Freq: Three times a day (TID) | INTRAVENOUS | Status: DC
  Administered 2016-09-22 – 2016-09-25 (×8): 3.375 g via INTRAVENOUS
  Filled 2016-09-22 (×8): qty 50

## 2016-09-22 MED ORDER — MORPHINE SULFATE (PF) 4 MG/ML IV SOLN: 4.0000 mg | INTRAVENOUS | Status: DC | PRN

## 2016-09-22 MED ORDER — PANTOPRAZOLE SODIUM 40 MG PO TBEC
40.0000 mg | DELAYED_RELEASE_TABLET | Freq: Every day | ORAL | Status: DC
  Administered 2016-09-23 – 2016-09-25 (×3): 40 mg via ORAL
  Filled 2016-09-22 (×3): qty 1

## 2016-09-22 MED ORDER — ONDANSETRON 4 MG PO TBDP: 4.0000 mg | ORAL_TABLET | Freq: Four times a day (QID) | ORAL | Status: DC | PRN

## 2016-09-22 NOTE — ED Provider Notes (Signed)
[facility] Emergency Department Provider Note  ____________________________________________   I have reviewed the triage vital signs and the nursing notes.   HISTORY  Chief Complaint Abdominal Pain    HPI Taylor Mack is a 82 y.o. female who presents today complaining of left lower quadrant abdominal pain. Began sometime last latter perhaps this morning. Patient is not exactly sure when. Denies any other symptoms. Nothing makes it better and nothing makes it worse. No fever no chills no nausea no vomiting no diarrhea. Has a history of diverticulitis she believes. Feels similar possibly. Claudie FishermanChin is at her baseline mentation according to EMS. Does have a history of Alzheimer's disease. History is somewhat limited by that.  Past Medical History:  Diagnosis Date  . Alzheimer's dementia   . Pancreatitis   . Stroke (HCC)   . Vascular dementia     Patient Active Problem List   Diagnosis Date Noted  . Pancreatitis, acute 12/02/2014  . Dementia 12/02/2014  . History of stroke 12/02/2014  . Acute pancreatitis 12/02/2014    Past Surgical History:  Procedure Laterality Date  . ABDOMINAL HYSTERECTOMY    . APPENDECTOMY    . CHOLECYSTECTOMY      Prior to Admission medications   Medication Sig Start Date End Date Taking? Authorizing Provider  atorvastatin (LIPITOR) 20 MG tablet Take 20 mg by mouth daily.    Historical Provider, MD  calcium-vitamin D (OSCAL WITH D) 500-200 MG-UNIT per tablet Take 0.5 tablets by mouth daily.    Historical Provider, MD  cephALEXin (KEFLEX) 500 MG capsule Take 1 capsule (500 mg total) by mouth 3 (three) times daily. Patient not taking: Reported on 12/03/2015 09/13/15   Darien Ramusavid W Kaminski, MD  clopidogrel (PLAVIX) 75 MG tablet Take 75 mg by mouth daily.    Historical Provider, MD  divalproex (DEPAKOTE SPRINKLE) 125 MG capsule Take 250 mg by mouth 3 (three) times daily.    Historical Provider, MD  docusate sodium (COLACE) 100 MG capsule  Take 100 mg by mouth 2 (two) times daily.    Historical Provider, MD  donepezil (ARICEPT) 10 MG tablet Take 10 mg by mouth at bedtime.    Historical Provider, MD  escitalopram (LEXAPRO) 20 MG tablet Take 10 mg by mouth daily.     Historical Provider, MD  ferrous sulfate 325 (65 FE) MG tablet Take 325 mg by mouth 2 (two) times daily with a meal.    Historical Provider, MD  metoprolol succinate (TOPROL-XL) 25 MG 24 hr tablet Take 12.5 mg by mouth daily.    Historical Provider, MD  pantoprazole (PROTONIX) 40 MG tablet Take 40 mg by mouth daily.    Historical Provider, MD  QUEtiapine (SEROQUEL) 25 MG tablet Take 1 tablet (25 mg total) by mouth at bedtime. Patient taking differently: Take 12.5 mg by mouth at bedtime.  09/13/15   Darien Ramusavid W Kaminski, MD  traZODone (DESYREL) 50 MG tablet Take 25 mg by mouth at bedtime.    Historical Provider, MD  Vitamin D, Ergocalciferol, (DRISDOL) 50000 UNITS CAPS capsule Take 50,000 Units by mouth every 7 (seven) days.    Historical Provider, MD    Allergies Codeine and Latex  History reviewed. No pertinent family history.  Social History Social History  Substance Use Topics  . Smoking status: Never Smoker  . Smokeless tobacco: Never Used  . Alcohol use No    Review of Systems Constitutional: No fever/chills Eyes: No visual changes. ENT: No sore throat. No stiff neck no neck pain  Cardiovascular: Denies chest pain. Respiratory: Denies shortness of breath. Gastrointestinal:   no vomiting.  No diarrhea.  No constipation. Genitourinary: Negative for dysuria. Musculoskeletal: Negative lower extremity swelling Skin: Negative for rash. Neurological: Negative for severe headaches, focal weakness or numbness. 10-point ROS otherwise negative.  ____________________________________________   PHYSICAL EXAM:  VITAL SIGNS: ED Triage Vitals  Enc Vitals Group     BP 09/22/16 0920 (!) 156/73     Pulse Rate 09/22/16 0920 (!) 55     Resp 09/22/16 0920 18      Temp --      Temp src --      SpO2 09/22/16 0906 95 %     Weight --      Height --      Head Circumference --      Peak Flow --      Pain Score --      Pain Loc --      Pain Edu? --      Excl. in GC? --     Constitutional: Alert and oriented. Well appearing and in no acute distress. Eyes: Conjunctivae are normal. PERRL. EOMI. Head: Atraumatic. Nose: No congestion/rhinnorhea. Mouth/Throat: Mucous membranes are moist.  Oropharynx non-erythematous. Neck: No stridor.   Nontender with no meningismus Cardiovascular: Normal rate, regular rhythm. Grossly normal heart sounds.  Good peripheral circulation. Respiratory: Normal respiratory effort.  No retractions. Lungs CTAB. Abdominal: Soft and positive tenderness palpation left lower quadrant with voluntary guarding no rebound nonsurgical soft abdomen. No distention.  Back:  There is no focal tenderness or step off.  there is no midline tenderness there are no lesions noted. there is no CVA tenderness Musculoskeletal: No lower extremity tenderness, no upper extremity tenderness. No joint effusions, no DVT signs strong distal pulses no edema Neurologic:  Normal speech and language. No gross focal neurologic deficits are appreciated.  Skin:  Skin is warm, dry and intact. No rash noted. Psychiatric: Mood and affect are normal. Speech and behavior are normal.  ____________________________________________   LABS (all labs ordered are listed, but only abnormal results are displayed)  Labs Reviewed  COMPREHENSIVE METABOLIC PANEL  CBC WITH DIFFERENTIAL/PLATELET  PROTIME-INR  LIPASE, BLOOD  URINALYSIS, COMPLETE (UACMP) WITH MICROSCOPIC   ____________________________________________  EKG  I personally interpreted any EKGs ordered by me or triage  ____________________________________________  RADIOLOGY  I reviewed any imaging ordered by me or triage that were performed during my shift and, if possible, patient and/or family made aware of  any abnormal findings. ____________________________________________   PROCEDURES  Procedure(s) performed: None  Procedures  Critical Care performed: None  ____________________________________________   INITIAL IMPRESSION / ASSESSMENT AND PLAN / ED COURSE  Pertinent labs & imaging results that were available during my care of the patient were reviewed by me and considered in my medical decision making (see chart for details).  Patient with focal left lower quadrant abdominal discomfort, possibly diverticular, less likely as AAA or vascular pathology, kidney stone or constipation is also on the differential, given her age, obviously she will require imaging we will continue to assess her at this time she does not appear to be at all uncomfortable we will give her pain medication as needed. Will also check blood work and establish IV.  Clinical Course    ____________________________________________   FINAL CLINICAL IMPRESSION(S) / ED DIAGNOSES  Final diagnoses:  None      This chart was dictated using voice recognition software.  Despite best efforts to proofread,  errors can occur  which can change meaning.      Jeanmarie PlantJames A McShane, MD 09/22/16 [phone]

## 2016-09-22 NOTE — ED Notes (Signed)
Dr. Michela PitcherEly in with pt for surgical consult

## 2016-09-22 NOTE — H&P (Signed)
Taylor Mack is a 82 y.o. female  with mild left lower quadrant abdominal pain.  HPI: She is a pleasantly demented elderly woman a resident of an assisted living facility who was transported to the emergency room this morning with left lower quadrant pain. Her son is present assist with the interview. She was doing well several days ago when he last saw her. She had an episode of shingles recently and was seen by her in facility physician yesterday. She was complaining of persistent abdominal pain which did not improve today. Because of her persistent symptoms she was referred to the emergency room for further evaluation.  With her sons help her history is pieced together. She was admitted the hospital several years ago with questionable diagnosis of pancreatitis. She was hospitalized for very short time and had no sequelae. She's never had a history of diverticulitis. Specifically she has a history of hepatitis yellow jaundice peptic ulcer disease. She has a previous cholecystectomy and hysterectomy. She has no known cardiac disease. She has had a stroke in the past is currently on Plavix. She carries the diagnosis of vascular dementia.  Laboratory evaluation did not reveal any significant elevation her white blood cell count. CT scan revealed what appears to be some descending colon diverticulitis with possible abscess. There is very small fluid collection either intramurally or extraluminally. There does not appear to be any significant free air. Because of the abscess formation the surgical service was consulted despite its very small size.  Past Medical History:  Diagnosis Date  . Alzheimer's dementia   . Pancreatitis   . Stroke (HCC)   . Vascular dementia    Past Surgical History:  Procedure Laterality Date  . ABDOMINAL HYSTERECTOMY    . APPENDECTOMY    . CHOLECYSTECTOMY     Social History   Social History  . Marital status: Single    Spouse name: N/A  . Number of children: N/A  .  Years of education: N/A   Social History Main Topics  . Smoking status: Never Smoker  . Smokeless tobacco: Never Used  . Alcohol use No  . Drug use: No  . Sexual activity: Not Asked   Other Topics Concern  . None   Social History Narrative  . None     ROS review of systems is really not possible in situation with the patient's dementia.   PHYSICAL EXAM: BP (!) 133/50   Pulse 69   Temp 99.5 F (37.5 C) (Oral)   Resp 18   SpO2 96%   Physical Exam  Constitutional: She appears well-developed and well-nourished. No distress.  HENT:  Head: Normocephalic and atraumatic.  Eyes: EOM are normal. Pupils are equal, round, and reactive to light.  Neck: Normal range of motion. Neck supple.  Cardiovascular: Normal rate, regular rhythm and normal heart sounds.   No murmur heard. Pulmonary/Chest: Effort normal and breath sounds normal. No respiratory distress. She has no wheezes.  Abdominal: Soft. Bowel sounds are normal. She exhibits distension. There is tenderness. There is guarding.  Musculoskeletal: Normal range of motion. She exhibits no edema or deformity.  Neurological: She is alert.  Pleasantly confused and disoriented  Skin: She is not diaphoretic.  Psychiatric: She has a normal mood and affect. Her behavior is normal. Thought content normal.   Her abdomen is mildly distended with some left lower quadrant tenderness. She does not have any significant rebound although she does have marked guarding in that area. She has hypoactive but present bowel  sounds.  Impression/Plan: I have independently reviewed her CT scan. I discussed situation with her son and the patient. She seems to have a diverticulitis with a possible perforation possible extraluminal abscess. The CT reading suggests an intramural abscess possible unsure as to how that might develop in this setting with diverticuli are not intramural processes. However she clearly has an inflammatory process in the descending colon  and should be treated with intravenous antibiotics. We'll admit her to the hospital start her antibiotic therapy and follow her clinical course. I do not see any indication for surgical intervention at the present time. This plan has been discussed with the patient and her son and they're both in agreement.   Tiney Rougealph Ely III, MD  09/22/2016, 7:55 PM

## 2016-09-22 NOTE — ED Triage Notes (Signed)
[facility]/o LUQ pain that started this morning. Staff stated that the pt refused to get out of bed due to the pain. Pt denies N/V/D. Pt's last BM was yesterday.

## 2016-09-23 LAB — BASIC METABOLIC PANEL
Anion gap: 6 (ref 5–15)
BUN: 17 mg/dL (ref 6–20)
CHLORIDE: 104 mmol/L (ref 101–111)
CO2: 28 mmol/L (ref 22–32)
Calcium: 8.6 mg/dL — ABNORMAL LOW (ref 8.9–10.3)
Creatinine, Ser: 1.18 mg/dL — ABNORMAL HIGH (ref 0.44–1.00)
GFR calc Af Amer: 49 mL/min — ABNORMAL LOW (ref >60)
GFR calc non Af Amer: 42 mL/min — ABNORMAL LOW (ref >60)
GLUCOSE: 105 mg/dL — AB (ref 65–99)
Potassium: 3.2 mmol/L — ABNORMAL LOW (ref 3.5–5.1)
SODIUM: 138 mmol/L (ref 135–145)

## 2016-09-23 LAB — CBC
HCT: 36.9 % (ref 35.0–47.0)
Hemoglobin: 12.7 g/dL (ref 12.0–16.0)
MCH: 30.7 pg (ref 26.0–34.0)
MCHC: 34.5 g/dL (ref 32.0–36.0)
MCV: 89.1 fL (ref 80.0–100.0)
Platelets: 132 10*3/uL — ABNORMAL LOW (ref 150–440)
RBC: 4.14 MIL/uL (ref 3.80–5.20)
RDW: 13 % (ref 11.5–14.5)
WBC: 8.8 10*3/uL (ref 3.6–11.0)

## 2016-09-23 MED ORDER — CHLORHEXIDINE GLUCONATE CLOTH 2 % EX PADS
6.0000 | MEDICATED_PAD | Freq: Every day | CUTANEOUS | Status: DC
  Administered 2016-09-24: 6 via TOPICAL

## 2016-09-23 MED ORDER — HALOPERIDOL LACTATE 5 MG/ML IJ SOLN: 5.0000 mg | Freq: Four times a day (QID) | INTRAMUSCULAR | Status: DC | PRN

## 2016-09-23 MED ORDER — LORAZEPAM 2 MG/ML IJ SOLN
2.0000 mg | INTRAMUSCULAR | Status: DC | PRN
  Filled 2016-09-23: qty 1

## 2016-09-23 MED ORDER — KCL IN DEXTROSE-NACL 40-5-0.45 MEQ/L-%-% IV SOLN
INTRAVENOUS | Status: DC
  Administered 2016-09-23 – 2016-09-25 (×3): via INTRAVENOUS
  Filled 2016-09-23 (×5): qty 1000

## 2016-09-23 MED ORDER — NYSTATIN 100000 UNIT/GM EX CREA
TOPICAL_CREAM | Freq: Three times a day (TID) | CUTANEOUS | Status: DC
  Filled 2016-09-23: qty 15

## 2016-09-23 MED ORDER — KETOROLAC TROMETHAMINE 15 MG/ML IJ SOLN: 15.0000 mg | Freq: Four times a day (QID) | INTRAMUSCULAR | Status: DC | PRN

## 2016-09-23 MED ORDER — MUPIROCIN 2 % EX OINT
1.0000 "application " | TOPICAL_OINTMENT | Freq: Two times a day (BID) | CUTANEOUS | Status: DC
  Administered 2016-09-23 – 2016-09-25 (×5): 1 via NASAL
  Filled 2016-09-23: qty 22

## 2016-09-23 NOTE — Progress Notes (Signed)
Subjective:   She had an episode of profound confusion this morning. However, this evening she is much better oriented and seems more comfortable. I suspect she had a episode of "sundowning". She complains of less abdominal pain. She's not been febrile. She's been tolerating a liquid diet. She did have a large bowel movement this evening.  Vital signs in last 24 hours: Temp:  [97.9 F (36.6 C)-98.1 F (36.7 C)] 98.1 F (36.7 C) (12/14 1257) Pulse Rate:  [56-69] 62 (12/14 1257) Resp:  [16-24] 18 (12/14 1257) BP: (102-139)/(49-66) 112/58 (12/14 1257) SpO2:  [95 %-97 %] 97 % (12/14 1257) Weight:  [72.5 kg (159 lb 14.4 oz)] 72.5 kg (159 lb 14.4 oz) (12/13 2126) Last BM Date: 09/20/16  Intake/Output from previous day: 12/13 0701 - 12/14 0700 In: 377 [I.V.:335; IV Piggyback:42] Out: -   Exam:  Her abdomen is soft but she has persistent left lower quadrant tenderness with guarding. She has no rebound. She does have active bowel sounds.  Lab Results:  CBC  Recent Labs  09/22/16 1011 09/23/16 0421  WBC 10.8 8.8  HGB 14.2 12.7  HCT 40.7 36.9  PLT 149* 132*   CMP     Component Value Date/Time   NA 138 09/23/2016 0421   NA 141 06/29/2014 1758   K 3.2 (L) 09/23/2016 0421   K 3.6 06/29/2014 1758   CL 104 09/23/2016 0421   CL 109 (H) 06/29/2014 1758   CO2 28 09/23/2016 0421   CO2 21 06/29/2014 1758   GLUCOSE 105 (H) 09/23/2016 0421   GLUCOSE 110 (H) 06/29/2014 1758   BUN 17 09/23/2016 0421   BUN 17 06/29/2014 1758   CREATININE 1.18 (H) 09/23/2016 0421   CREATININE 1.44 (H) 06/29/2014 1758   CALCIUM 8.6 (L) 09/23/2016 0421   CALCIUM 8.8 06/29/2014 1758   PROT 7.3 09/22/2016 1011   PROT 7.4 06/29/2014 1758   ALBUMIN 3.7 09/22/2016 1011   ALBUMIN 3.8 06/29/2014 1758   AST 22 09/22/2016 1011   AST 34 06/29/2014 1758   ALT 13 (L) 09/22/2016 1011   ALT 21 06/29/2014 1758   ALKPHOS 62 09/22/2016 1011   ALKPHOS 115 06/29/2014 1758   BILITOT 1.2 09/22/2016 1011   BILITOT 0.4  06/29/2014 1758   GFRNONAA 42 (L) 09/23/2016 0421   GFRNONAA 35 (L) 06/29/2014 1758   GFRAA 49 (L) 09/23/2016 0421   GFRAA 40 (L) 06/29/2014 1758   PT/INR  Recent Labs  09/22/16 1011  LABPROT 13.4  INR 1.02    Studies/Results: Ct Abdomen Pelvis W Contrast  Result Date: 09/22/2016 CLINICAL DATA:  left lower quadrant abdominal pain. Began sometime last latter perhaps this morning. Patient is not exactly sure when. Denies any other symptoms. Nothing makes it better and nothing makes it worse. No fever no chills no nausea no vomiting no diarrhea. Has a history of diverticulitis she believes. Feels similar possibly. Taylor Mack is at her baseline mentation according to EMS. Does have a history of Alzheimer's disease. EXAM: CT ABDOMEN AND PELVIS WITH CONTRAST TECHNIQUE: Multidetector CT imaging of the abdomen and pelvis was performed using the standard protocol following bolus administration of intravenous contrast. CONTRAST:  100mL ISOVUE-300 IOPAMIDOL (ISOVUE-300) INJECTION 61%, 30mL ISOVUE-300 IOPAMIDOL (ISOVUE-300) INJECTION 61% COMPARISON:  11/26/2014 FINDINGS: Lower chest: Mitral annulus calcifications. No pleural or pericardial effusion. Dependent subsegmental atelectasis in the visualized lower lobes. Hepatobiliary: No focal liver abnormality is seen. Status post cholecystectomy. No biliary dilatation. Pancreas: Unremarkable. No pancreatic ductal dilatation or surrounding inflammatory changes.  Spleen: No splenic injury or perisplenic hematoma. Calcified granuloma. Adrenals/Urinary Tract: Stable probable cysts, left lower pole. No solid lesion or hydronephrosis. Normal adrenal glands. Ureters decompressed. Urinary bladder incompletely distended. Stomach/Bowel: Small hiatal hernia. Stomach nondilated. Small bowel decompressed. Appendix not identified. Colon nondilated. Multiple distal descending and sigmoid diverticula. Moderate inflammatory/ edematous changes around the descending/ sigmoid junction with  small intramural abscess less than 2 cm. No free air or drainable collection. Vascular/Lymphatic: Scattered aortoiliac arterial calcifications without aneurysm or stenosis. Portal vein patent. Reproductive: Status post hysterectomy. No adnexal masses. Other: No ascites.  No abdominal wall hernia. Musculoskeletal: Stable spondylitic changes in the lumbar spine. No acute fracture. IMPRESSION: 1. Diverticulitis involving descending/sigmoid junction with small intramural abscess, no drainable collection. Electronically Signed   By: Corlis Leak  Hassell M.D.   On: 09/22/2016 12:16    Assessment/Plan: We will continue the current plan. We will leave her antibiotics. I spoke with her son about her dementia. He appears to be in agreement.

## 2016-09-23 NOTE — Evaluation (Signed)
Physical Therapy Evaluation Patient Details Name: Taylor Mack MRN: [id number] DOB: 06-Jan-1934 Today's Date: 09/23/2016   History of Present Illness  82 y/o female here with abdominal pain, admitted with diverticulitis.  Pt with history of vascular dementia   Clinical Impression  Pt pleasantly confused t/o the session and was able to ambulate around the nurses' station w/o AD.  She was slow and at times distracted, but overall appeared to have good relative safety and confidence.  She reports that she is walking slower than her baseline and if this is so would benefit from skilled PT in the hospital, depending on actual PLOF and progress here she may not need further PT intervention after DC, otherwise HHPT is probably appropriate.     Follow Up Recommendations  (per progress and comparison to baseline, may not need f/u?)    Equipment Recommendations       Recommendations for Other Services       Precautions / Restrictions Precautions Precautions: Fall Restrictions Weight Bearing Restrictions: No      Mobility  Bed Mobility Overal bed mobility: Modified Independent             General bed mobility comments: Pt able to get self back into bed, was content to be askew and did not follow instruction to get herself sorted in bed needing some assist  Transfers Overall transfer level: Modified independent Equipment used: None             General transfer comment: Pt able to stand with good confidence w/o assist, did not need AD and maintained good balance  Ambulation/Gait Ambulation/Gait assistance: Supervision Ambulation Distance (Feet):  (200) Assistive device: None       General Gait Details: Pt with slow, deliberate gait, but overall was safe with only very small stagger stepping episodes that seemed more related to trying to talk/joking with people in the hallway.  She was inconsistent with following instructions well when cued.  Stairs             Wheelchair Mobility    Modified Rankin (Stroke Patients Only)       Balance                                             Pertinent Vitals/Pain Pain Assessment: No/denies pain    Home Living Family/patient expects to be discharged to:: Assisted living                 Additional Comments: Pt pleasantly confused t/o the session, unable to fully report living situation    Prior Function           Comments: Pt reports that she is able to walk as much as she needs w/o AD     Hand Dominance        Extremity/Trunk Assessment   Upper Extremity Assessment Upper Extremity Assessment: Overall WFL for tasks assessed    Lower Extremity Assessment Lower Extremity Assessment: Overall WFL for tasks assessed       Communication   Communication: No difficulties  Cognition Arousal/Alertness: Awake/alert Behavior During Therapy: Restless Overall Cognitive Status: History of cognitive impairments - at baseline                 General Comments: joking entire session, some difficulty following instruction    General Comments      Exercises  Assessment/Plan    PT Assessment Patient needs continued PT services  PT Problem List Decreased balance;Decreased mobility;Decreased coordination;Decreased cognition;Decreased safety awareness          PT Treatment Interventions DME instruction;Gait training;Stair training;Functional mobility training;Therapeutic activities;Therapeutic exercise;Balance training;Neuromuscular re-education;Cognitive remediation;Patient/family education    PT Goals (Current goals can be found in the Care Plan section)  Acute Rehab PT Goals PT Goal Formulation: Patient unable to participate in goal setting Time For Goal Achievement: 10/07/16 Potential to Achieve Goals: Fair    Frequency Min 2X/week   Barriers to discharge        Co-evaluation               End of Session Equipment Utilized During  Treatment: Gait belt Activity Tolerance: Patient tolerated treatment well Patient left: with bed alarm set;with call bell/phone within reach Nurse Communication: Mobility status         Time: [id number] PT Time Calculation (min) (ACUTE ONLY): 25 min   Charges:   PT Evaluation $PT Eval Low Complexity: 1 Procedure     PT G CodesMalachi Pro:        Nochum Fenter R Khale Nigh, DPT 09/23/2016, 4:59 PM

## 2016-09-23 NOTE — Progress Notes (Signed)
82 year old female with multiple medical issues and dementia here with diverticulitis. The patient did have a combative episode overnight and did take her IV out. She was able to calm down without needing any medication. She does still seem confused today and her but is pleasant and cooperative at this time. She states that she does still have some pain that she does feel better than when she first came in.  Vitals:   09/23/16 0804 09/23/16 1257  BP: (!) 111/49 (!) 112/58  Pulse: 61 62  Resp: 16 18  Temp: 98 F (36.7 C) 98.1 F (36.7 C)   I/O last 3 completed shifts: In: 377 [I.V.:335; IV Piggyback:42] Out: -  Total I/O In: 252 [I.V.:220; IV Piggyback:32] Out: -    PE:  Gen: NAD Abd: soft, tender in LLQ, non-distended, no rebound  CBC Latest Ref Rng & Units 09/23/2016 09/22/2016 12/03/2015  WBC 3.6 - 11.0 K/uL 8.8 10.8 5.1  Hemoglobin 12.0 - 16.0 g/dL 78.212.7 95.614.2 21.314.2  Hematocrit 35.0 - 47.0 % 36.9 40.7 41.5  Platelets 150 - 440 K/uL 132(L) 149(L) 161   CMP Latest Ref Rng & Units 09/23/2016 09/22/2016 12/03/2015  Glucose 65 - 99 mg/dL 086(V105(H) 784(O111(H) 962(X106(H)  BUN 6 - 20 mg/dL 17 14 52(W24(H)  Creatinine 0.44 - 1.00 mg/dL 4.13(K1.18(H) 4.400.97 1.02(V1.06(H)  Sodium 135 - 145 mmol/L 138 136 138  Potassium 3.5 - 5.1 mmol/L 3.2(L) 3.7 4.2  Chloride 101 - 111 mmol/L 104 101 106  CO2 22 - 32 mmol/L 28 27 25   Calcium 8.9 - 10.3 mg/dL 2.5(D8.6(L) 9.3 9.2  Total Protein 6.5 - 8.1 g/dL - 7.3 7.2  Total Bilirubin 0.3 - 1.2 mg/dL - 1.2 0.8  Alkaline Phos 38 - 126 U/L - 62 54  AST 15 - 41 U/L - 22 36  ALT 14 - 54 U/L - 13(L) 1228   A/P:  82 year old female with multiple medical issues with diverticulitis. She was started on Zosyn and has some improvement in the pain. She has combative with some dementia and not always the best historian. We will continue with pain control we'll give her a clear liquid diet today and continue with IV antibiotics.

## 2016-09-24 LAB — URINE CULTURE

## 2016-09-24 LAB — BASIC METABOLIC PANEL WITH GFR
Anion gap: 7 (ref 5–15)
BUN: 13 mg/dL (ref 6–20)
CO2: 26 mmol/L (ref 22–32)
Calcium: 8.7 mg/dL — ABNORMAL LOW (ref 8.9–10.3)
Chloride: 106 mmol/L (ref 101–111)
Creatinine, Ser: 1.05 mg/dL — ABNORMAL HIGH (ref 0.44–1.00)
GFR calc Af Amer: 57 mL/min — ABNORMAL LOW
GFR calc non Af Amer: 49 mL/min — ABNORMAL LOW
Glucose, Bld: 87 mg/dL (ref 65–99)
Potassium: 3.3 mmol/L — ABNORMAL LOW (ref 3.5–5.1)
Sodium: 139 mmol/L (ref 135–145)

## 2016-09-24 LAB — CBC
HCT: 36.5 % (ref 35.0–47.0)
Hemoglobin: 12.8 g/dL (ref 12.0–16.0)
MCH: 31.4 pg (ref 26.0–34.0)
MCHC: 35 g/dL (ref 32.0–36.0)
MCV: 89.6 fL (ref 80.0–100.0)
Platelets: 143 K/uL — ABNORMAL LOW (ref 150–440)
RBC: 4.07 MIL/uL (ref 3.80–5.20)
RDW: 13.1 % (ref 11.5–14.5)
WBC: 5.9 K/uL (ref 3.6–11.0)

## 2016-09-24 MED ORDER — ACETAMINOPHEN 325 MG PO TABS
650.0000 mg | ORAL_TABLET | Freq: Four times a day (QID) | ORAL | Status: DC
  Administered 2016-09-24 – 2016-09-25 (×6): 650 mg via ORAL
  Filled 2016-09-24 (×5): qty 2

## 2016-09-24 NOTE — Clinical Social Work Note (Signed)
Clinical Social Work Assessment  Patient Details  Name: Taylor Mack MRN: [id number] Date of Birth: 1933/12/14  Date of referral:  09/24/16               Reason for consult:  Facility Placement                Permission sought to share information with:    Permission granted to share information::     Name::        Agency::     Relationship::     Contact Information:     Housing/Transportation Living arrangements for the past 2 months:  Assisted Living Facility Source of Information:  Facility Patient Interpreter Needed:  None Criminal Activity/Legal Involvement Pertinent to Current Situation/Hospitalization:  No - Comment as needed Significant Relationships:  Adult Children Lives with:  Facility Resident Do you feel safe going back to the place where you live?    Need for family participation in patient care:     Care giving concerns:  [facility].   Social Worker assessment / plan:  CSW has attempted to reach patient's son: Dionicia AblerMatthew Charles: [phone] and [phone] but have been unsuccessful. CSW spoke with Trish: [phone] [facility] and updated her. She stated that patient is in their memory care and can return at discharge. FL2 done but not yet sent for signature.  Employment status:  Retired Database administratornsurance information:  Managed Medicare PT Recommendations:    Information / Referral to community resources:     Patient/Family's Response to care:  Unable to reach family as of yet and patient remains confused.  Patient/Family's Understanding of and Emotional Response to Diagnosis, Current Treatment, and Prognosis:  Unable to reach family as of yet and patient remains confused.  Emotional Assessment Appearance:  Appears stated age Attitude/Demeanor/Rapport:   (pleasant currently) Affect (typically observed):  Quiet Orientation:  Oriented to Self Alcohol / Substance use:  Not Applicable Psych involvement (Current and /or in the  community):  No (Comment)  Discharge Needs  Concerns to be addressed:  Care Coordination Readmission within the last 30 days:  No Current discharge risk:  None Barriers to Discharge:  No Barriers Identified   York SpanielMonica Dru Laurel, LCSW 09/24/2016, 11:51 AM

## 2016-09-24 NOTE — Progress Notes (Signed)
82 year old female with multiple medical issues and dementia here with diverticulitis. The patient did well overnight, no combative episodes.  She states that her abdomen feels well this AM only minimal tenderness.  She tolerated the clears well with no nausea.  She did have some liquid stools.   Vitals:   09/23/16 1257 09/23/16 2047  BP: (!) 112/58 140/61  Pulse: 62 65  Resp: 18 18  Temp: 98.1 F (36.7 C) 99.1 F (37.3 C)   I/O last 3 completed shifts: In: 1920 [P.O.:180; I.V.:1611; IV Piggyback:129] Out: 225 [Urine:225] No intake/output data recorded.  PE:  Gen: NAD Res: CTAB/L  Cardio: RRR Abd: soft, minimal tenderness in LLQ today, much improved from yesterday, non-distended, no rebound Ext: no edema  CBC Latest Ref Rng & Units 09/24/2016 09/23/2016 09/22/2016  WBC 3.6 - 11.0 K/uL 5.9 8.8 10.8  Hemoglobin 12.0 - 16.0 g/dL 16.112.8 09.612.7 04.514.2  Hematocrit 35.0 - 47.0 % 36.5 36.9 40.7  Platelets 150 - 440 K/uL 143(L) 132(L) 149(L)   CMP Latest Ref Rng & Units 09/24/2016 09/23/2016 09/22/2016  Glucose 65 - 99 mg/dL 87 409(W105(H) 119(J111(H)  BUN 6 - 20 mg/dL 13 17 14   Creatinine 0.44 - 1.00 mg/dL 4.78(G1.05(H) 9.56(O1.18(H) 1.300.97  Sodium 135 - 145 mmol/L 139 138 136  Potassium 3.5 - 5.1 mmol/L 3.3(L) 3.2(L) 3.7  Chloride 101 - 111 mmol/L 106 104 101  CO2 22 - 32 mmol/L 26 28 27   Calcium 8.9 - 10.3 mg/dL 8.6(V8.7(L) 7.8(I8.6(L) 9.3  Total Protein 6.5 - 8.1 g/dL - - 7.3  Total Bilirubin 0.3 - 1.2 mg/dL - - 1.2  Alkaline Phos 38 - 126 U/L - - 62  AST 15 - 41 U/L - - 22  ALT 14 - 54 U/L - - 13(L)   A/P:  82 year old female with multiple medical issues with diverticulitis. She was started on Zosyn and has good improvement in the pain. I will schedule tylenol po for pain control and advance to soft diet today.  PT consult since she will return back to the SNF on discharge

## 2016-09-24 NOTE — Progress Notes (Signed)
Physical Therapy Treatment Patient Details Name: Taylor Mack MRN: [id number] DOB: December 10, 1933 Today's Date: 09/24/2016    History of Present Illness 82 y/o female here with abdominal pain, admitted with diverticulitis.  Pt with history of vascular dementia     PT Comments    Ms. [name] was unpleasant and agitated this afternoon but agreeable to therapy.  She ambulated 200 ft without AD, demonstrating mild instability but no LOB or need for assist.  She declined completing therapeutic exercises today.  PT will continue to follow acutely.   Follow Up Recommendations  No PT follow up (per pt she is at her baseline, no family present to confirm)     Equipment Recommendations  None recommended by PT    Recommendations for Other Services       Precautions / Restrictions Precautions Precautions: Fall Restrictions Weight Bearing Restrictions: No    Mobility  Bed Mobility Overal bed mobility: Needs Assistance Bed Mobility: Supine to Sit;Sit to Supine     Supine to sit: Modified independent (Device/Increase time);HOB elevated Sit to supine: Mod assist   General bed mobility comments: When attempting sit>supine and bringing her LEs into bed she began rolling off side of bed and this PT provided assist to avoid falling.    Transfers Overall transfer level: Modified independent Equipment used: None             General transfer comment: Pt able to stand with good confidence w/o assist, did not need AD and maintained good balance  Ambulation/Gait Ambulation/Gait assistance: Supervision Ambulation Distance (Feet): 200 Feet Assistive device: None Gait Pattern/deviations: Step-through pattern Gait velocity: decreased Gait velocity interpretation: Below normal speed for age/gender General Gait Details: Pt mildly unsteady when turning and looking around hallway but no LOB and no assist needed.     Stairs            Wheelchair Mobility    Modified Rankin (Stroke  Patients Only)       Balance Overall balance assessment: Needs assistance Sitting-balance support: No upper extremity supported;Feet supported Sitting balance-Leahy Scale: Good     Standing balance support: No upper extremity supported;During functional activity Standing balance-Leahy Scale: Good Standing balance comment: Steady during static activities, mild unsteadiness with dynamic activities                    Cognition Arousal/Alertness: Awake/alert Behavior During Therapy: Agitated Overall Cognitive Status: History of cognitive impairments - at baseline                      Exercises      General Comments General comments (skin integrity, edema, etc.): Pt declined therapeutic exercises      Pertinent Vitals/Pain Pain Assessment: No/denies pain    Home Living                      Prior Function            PT Goals (current goals can now be found in the care plan section) Acute Rehab PT Goals PT Goal Formulation: Patient unable to participate in goal setting Time For Goal Achievement: 10/07/16 Potential to Achieve Goals: Fair Progress towards PT goals: Progressing toward goals    Frequency    Min 2X/week      PT Plan Current plan remains appropriate    Co-evaluation             End of Session Equipment Utilized During Treatment: Gait belt Activity  Tolerance: Patient tolerated treatment well Patient left: with bed alarm set;with call bell/phone within reach;in bed     Time: 1610-96041522-1545 PT Time Calculation (min) (ACUTE ONLY): 23 min  Charges:  $Gait Training: 8-22 mins                    G Codes:      Encarnacion ChuAshley Abashian PT, DPT 09/24/2016, 4:03 PM

## 2016-09-25 MED ORDER — AMOXICILLIN-POT CLAVULANATE 875-125 MG PO TABS: 1.0000 | ORAL_TABLET | Freq: Two times a day (BID) | ORAL | 0 refills | Status: DC

## 2016-09-25 NOTE — Discharge Summary (Signed)
Physician Discharge Summary  Patient ID: Taylor Mack MRN: [id number] DOB/AGE: [birth year] 82 y.o.  Admit date: 09/22/2016 Discharge date: 09/25/2016  Admission Diagnoses:  Diverticulitis of large intestine  Discharge Diagnoses:  Active Problems:   Diverticulitis of large intestine with abscess without bleeding   Discharged Condition: good  Hospital Course: 82 yr old female with diverticulitis with small intramural abscess.  Patient has done well in the hospital, she did have one episode of confusion.  She is no longer having the abdominal pain and tolerating a regular diet.  She will be discharged back to the nursing facility today with Augmentin twice daily for 12 days.  She has a follow-up with my partner Dr. Aleen CampiPiscoya next week.    Consults: None  Significant Diagnostic Studies: CT scan  Treatments: antibiotics: Zosyn  Discharge Exam: Blood pressure 130/60, pulse (!) 51, temperature 97.9 F (36.6 C), temperature source Oral, resp. rate 17, height 5\' 2"  (1.575 m), weight 159 lb 14.4 oz (72.5 kg), SpO2 100 %. General appearance: alert, cooperative and no distress GI: soft, non tender, non distended Extremities: extremities normal, atraumatic, no cyanosis or edema  Disposition: 01-Home or Self Care  Discharge Instructions    Call MD for:  persistant nausea and vomiting    Complete by:  As directed    Call MD for:  redness, tenderness, or signs of infection (pain, swelling, redness, odor or green/yellow discharge around incision site)    Complete by:  As directed    Call MD for:  severe uncontrolled pain    Complete by:  As directed    Call MD for:  temperature >100.4    Complete by:  As directed    Diet - low sodium heart healthy    Complete by:  As directed    Increase activity slowly    Complete by:  As directed    May shower / Bathe    Complete by:  As directed      Allergies as of 09/25/2016      Reactions   Codeine Nausea And Vomiting   Latex Other (See  Comments)   unknown      Medication List    TAKE these medications   amoxicillin-clavulanate 875-125 MG tablet Commonly known as:  AUGMENTIN Take 1 tablet by mouth 2 (two) times daily.   atorvastatin 20 MG tablet Commonly known as:  LIPITOR Take 20 mg by mouth daily.   calcium-vitamin D 500-200 MG-UNIT tablet Commonly known as:  OSCAL WITH D Take 0.5 tablets by mouth daily.   clopidogrel 75 MG tablet Commonly known as:  PLAVIX Take 75 mg by mouth daily.   divalproex 125 MG capsule Commonly known as:  DEPAKOTE SPRINKLE Take 250 mg by mouth 3 (three) times daily.   docusate sodium 100 MG capsule Commonly known as:  COLACE Take 100 mg by mouth 2 (two) times daily.   donepezil 10 MG tablet Commonly known as:  ARICEPT Take 10 mg by mouth at bedtime.   Melatonin 5 MG Tabs Take 1 tablet by mouth at bedtime.   metoprolol succinate 25 MG 24 hr tablet Commonly known as:  TOPROL-XL Take 12.5 mg by mouth daily.   pantoprazole 40 MG tablet Commonly known as:  PROTONIX Take 40 mg by mouth daily.   QUEtiapine 25 MG tablet Commonly known as:  SEROQUEL Take 1 tablet (25 mg total) by mouth at bedtime.   sertraline 50 MG tablet Commonly known as:  ZOLOFT Take 50 mg by mouth at  bedtime.   silver sulfADIAZINE 1 % cream Commonly known as:  SILVADENE Apply 1 application topically daily.   Vitamin D (Ergocalciferol) 50000 units Caps capsule Commonly known as:  DRISDOL Take 50,000 Units by mouth every 7 (seven) days.      Follow-up Information    Henrene DodgeJose Piscoya, MD Follow up.   Specialty:  Surgery Why:  f/u with Dr. Aleen CampiPiscoya on 12/21 at 9:45AM Contact information: [address]  [address] [place] [id number] [phone]           Signed: Gladis RiffleCatherine L Santi Troung 09/25/2016, 1:32 PM

## 2016-09-25 NOTE — Clinical Social Work Note (Signed)
[facility] to [facility] today via transport with her son. Facility is aware. CSW will con't to follow pending additional dc needs.  Argentina PonderKaren Martha Disha Cottam, MSW, LCSW-A [phone]

## 2016-09-25 NOTE — Progress Notes (Signed)
Pt prepared for d/c to [facility]. IV d/c'd. Skin intact except as charted in most recent assessments. Vitals are stable. Report called to receiving facility. Pt is being transported by son, chuck, POA.  Taylor Mack [place]

## 2016-09-25 NOTE — [place] FL2 (Signed)
[place] MEDICAID FL2 LEVEL OF CARE SCREENING TOOL     IDENTIFICATION  Patient Name: Taylor Mack Birthdate: 1933/10/18 Sex: female Admission Date (Current Location): 09/22/2016  [place]   Facility and Address:  [facility], [address], [place], [place] [address]      Provider Number: [id number]  Attending Physician Name and Address:  Tiney Rougealph Ely III, MD  Relative Name and Phone Number:       Current Level of Care: Hospital Recommended Level of Care: Assisted Living Facility, Memory Care Prior Approval Number:    Date Approved/Denied:   PASRR Number:    Discharge Plan:  (ALF Memory Care)    Current Diagnoses: Patient Active Problem List   Diagnosis Date Noted  . Diverticulitis of large intestine with abscess without bleeding 09/22/2016  . Pancreatitis, acute 12/02/2014  . Dementia 12/02/2014  . History of stroke 12/02/2014  . Acute pancreatitis 12/02/2014    Orientation RESPIRATION BLADDER Height & Weight     Self  Normal Continent Weight: 159 lb 14.4 oz (72.5 kg) Height:  5\' 2"  (157.5 cm)  BEHAVIORAL SYMPTOMS/MOOD NEUROLOGICAL BOWEL NUTRITION STATUS   (none)   Continent Diet (soft diet)  AMBULATORY STATUS COMMUNICATION OF NEEDS Skin   Supervision Verbally Normal                       Personal Care Assistance Level of Assistance  Bathing, Dressing Bathing Assistance: Limited assistance   Dressing Assistance: Limited assistance     Functional Limitations Info             SPECIAL CARE FACTORS FREQUENCY                       Contractures Contractures Info: Not present    Additional Factors Info  Code Status Code Status Info: full Allergies Info: codeine;lasix           Current Medications (09/25/2016):  This is the current hospital active medication list Current Facility-Administered Medications  Medication Dose Route Frequency Provider Last Rate Last Dose  .  acetaminophen (TYLENOL) tablet 650 mg  650 mg Oral Q6H Gladis Riffleatherine L Clevland Cork, MD   650 mg at 09/25/16 0608  . atorvastatin (LIPITOR) tablet 20 mg  20 mg Oral Daily Tiney Rougealph Ely III, MD   20 mg at 09/25/16 0941  . calcium-vitamin D (OSCAL WITH D) 500-200 MG-UNIT per tablet 0.5 tablet  0.5 tablet Oral Daily Tiney Rougealph Ely III, MD   0.5 tablet at 09/25/16 0941  . Chlorhexidine Gluconate Cloth 2 % PADS 6 each  6 each Topical Q0600 Tiney Rougealph Ely III, MD   6 each at 09/24/16 0500  . dextrose 5 % and 0.45 % NaCl with KCl 40 mEq/L infusion   Intravenous Continuous Tiney Rougealph Ely III, MD 75 mL/hr at 09/25/16 [phone]    . divalproex (DEPAKOTE SPRINKLE) capsule 250 mg  250 mg Oral TID Tiney Rougealph Ely III, MD   250 mg at 09/25/16 [phone]  . donepezil (ARICEPT) tablet 10 mg  10 mg Oral QHS Tiney Rougealph Ely III, MD   10 mg at 09/24/16 2345  . enoxaparin (LOVENOX) injection 40 mg  40 mg Subcutaneous Q24H Tiney Rougealph Ely III, MD   40 mg at 09/24/16 2345  . haloperidol lactate (HALDOL) injection 5 mg  5 mg Intravenous Q6H PRN Gladis Riffleatherine L Viva Gallaher, MD      . ketorolac (TORADOL) 15 MG/ML injection 15 mg  15 mg Intravenous  Q6H PRN Gladis Riffleatherine L Gabbi Whetstone, MD      . Melatonin TABS 5 mg  1 tablet Oral QHS Tiney Rougealph Ely III, MD   5 mg at 09/24/16 2346  . metoprolol succinate (TOPROL-XL) 24 hr tablet 12.5 mg  12.5 mg Oral Daily Tiney Rougealph Ely III, MD   Stopped at 09/24/16 [phone]  . mupirocin ointment (BACTROBAN) 2 % 1 application  1 application Nasal BID Tiney Rougealph Ely III, MD   1 application at 09/25/16 [phone]  . ondansetron (ZOFRAN-ODT) disintegrating tablet 4 mg  4 mg Oral Q6H PRN Tiney Rougealph Ely III, MD       Or  . ondansetron [facility](ZOFRAN) injection 4 mg  4 mg Intravenous Q6H PRN Tiney Rougealph Ely III, MD      . pantoprazole (PROTONIX) EC tablet 40 mg  40 mg Oral Daily Tiney Rougealph Ely III, MD   40 mg at 09/25/16 0941  . piperacillin-tazobactam (ZOSYN) IVPB 3.375 g  3.375 g Intravenous Q8H Tiney Rougealph Ely III, MD   3.375 g at 09/25/16 0606  . QUEtiapine (SEROQUEL) tablet 25 mg  25 mg Oral QHS Tiney Rougealph Ely III, MD   25  mg at 09/24/16 2346  . sertraline (ZOLOFT) tablet 50 mg  50 mg Oral QHS Tiney Rougealph Ely III, MD   50 mg at 09/24/16 2346     Discharge Medications: TAKE these medications   amoxicillin-clavulanate 875-125 MG tablet Commonly known as:  AUGMENTIN Take 1 tablet by mouth 2 (two) times daily.  atorvastatin 20 MG tablet Commonly known as:  LIPITOR Take 20 mg by mouth daily.  calcium-vitamin D 500-200 MG-UNIT tablet Commonly known as:  OSCAL WITH D Take 0.5 tablets by mouth daily.  clopidogrel 75 MG tablet Commonly known as:  PLAVIX Take 75 mg by mouth daily.  divalproex 125 MG capsule Commonly known as:  DEPAKOTE SPRINKLE Take 250 mg by mouth 3 (three) times daily.  docusate sodium 100 MG capsule Commonly known as:  COLACE Take 100 mg by mouth 2 (two) times daily.  donepezil 10 MG tablet Commonly known as:  ARICEPT Take 10 mg by mouth at bedtime.  Melatonin 5 MG Tabs Take 1 tablet by mouth at bedtime.  metoprolol succinate 25 MG 24 hr tablet Commonly known as:  TOPROL-XL Take 12.5 mg by mouth daily.  pantoprazole 40 MG tablet Commonly known as:  PROTONIX Take 40 mg by mouth daily.  QUEtiapine 25 MG tablet Commonly known as:  SEROQUEL Take 1 tablet (25 mg total) by mouth at bedtime.  sertraline 50 MG tablet Commonly known as:  ZOLOFT Take 50 mg by mouth at bedtime.  silver sulfADIAZINE 1 % cream Commonly known as:  SILVADENE Apply 1 application topically daily.  Vitamin D (Ergocalciferol) 50000 units Caps capsule Commonly known as:  DRISDOL Take 50,000 Units by mouth every 7 (seven) days.     Relevant Imaging Results:  Relevant Lab Results:   Additional Information    Judi CongKaren M White, LCSW

## 2016-09-30 ENCOUNTER — Ambulatory Visit (INDEPENDENT_AMBULATORY_CARE_PROVIDER_SITE_OTHER): Payer: MEDICARE | Admitting: Surgery

## 2016-09-30 ENCOUNTER — Encounter: Payer: Self-pay | Admitting: Surgery

## 2016-09-30 VITALS — BP 131/67 | HR 57 | Temp 97.9°F | Ht 62.0 in | Wt 165.0 lb

## 2016-09-30 DIAGNOSIS — K5720 Diverticulitis of large intestine with perforation and abscess without bleeding: Secondary | ICD-10-CM | POA: Diagnosis not present

## 2016-09-30 NOTE — Patient Instructions (Signed)
Please increase Fiber in your daily diet. You may take Metamucil. Please be sure to increase water to 72 ounces a day. Please call our office if you have any questions or concerns.  We would like for you to see the Gastroenterologist in 6-8 weeks for possible Colonoscopy.   High-Fiber Diet Fiber, also called dietary fiber, is a type of carbohydrate found in fruits, vegetables, whole grains, and beans. A high-fiber diet can have many health benefits. Your health care provider may recommend a high-fiber diet to help:  Prevent constipation. Fiber can make your bowel movements more regular.  Lower your cholesterol.  Relieve hemorrhoids, uncomplicated diverticulosis, or irritable bowel syndrome.  Prevent overeating as part of a weight-loss plan.  Prevent heart disease, type 2 diabetes, and certain cancers. What is my plan? The recommended daily intake of fiber includes:  38 grams for men under age 80.  30 grams for men over age 80.  25 grams for women under age 80.  21 grams for women over age 80. You can get the recommended daily intake of dietary fiber by eating a variety of fruits, vegetables, grains, and beans. Your health care provider may also recommend a fiber supplement if it is not possible to get enough fiber through your diet. What do I need to know about a high-fiber diet?  Fiber supplements have not been widely studied for their effectiveness, so it is better to get fiber through food sources.  Always check the fiber content on thenutrition facts label of any prepackaged food. Look for foods that contain at least 5 grams of fiber per serving.  Ask your dietitian if you have questions about specific foods that are related to your condition, especially if those foods are not listed in the following section.  Increase your daily fiber consumption gradually. Increasing your intake of dietary fiber too quickly may cause bloating, cramping, or gas.  Drink plenty of water. Water  helps you to digest fiber. What foods can I eat? Grains  Whole-grain breads. Multigrain cereal. Oats and oatmeal. Brown rice. Barley. Bulgur wheat. Millet. Bran muffins. Popcorn. Rye wafer crackers. Vegetables  Sweet potatoes. Spinach. Kale. Artichokes. Cabbage. Broccoli. Green peas. Carrots. Squash. Fruits  Berries. Pears. Apples. Oranges. Avocados. Prunes and raisins. Dried figs. Meats and Other Protein Sources  Navy, kidney, pinto, and soy beans. Split peas. Lentils. Nuts and seeds. Dairy  Fiber-fortified yogurt. Beverages  Fiber-fortified soy milk. Fiber-fortified orange juice. Other  Fiber bars. The items listed above may not be a complete list of recommended foods or beverages. Contact your dietitian for more options.  What foods are not recommended? Grains  White bread. Pasta made with refined flour. White rice. Vegetables  Fried potatoes. Canned vegetables. Well-cooked vegetables. Fruits  Fruit juice. Cooked, strained fruit. Meats and Other Protein Sources  Fatty cuts of meat. Fried Environmental education officerpoultry or fried fish. Dairy  Milk. Yogurt. Cream cheese. Sour cream. Beverages  Soft drinks. Other  Cakes and pastries. Butter and oils. The items listed above may not be a complete list of foods and beverages to avoid. Contact your dietitian for more information.  What are some tips for including high-fiber foods in my diet?  Eat a wide variety of high-fiber foods.  Make sure that half of all grains consumed each day are whole grains.  Replace breads and cereals made from refined flour or white flour with whole-grain breads and cereals.  Replace white rice with brown rice, bulgur wheat, or millet.  Start the day with a  breakfast that is high in fiber, such as a cereal that contains at least 5 grams of fiber per serving.  Use beans in place of meat in soups, salads, or pasta.  Eat high-fiber snacks, such as berries, raw vegetables, nuts, or popcorn. This information is not intended  to replace advice given to you by your health care provider. Make sure you discuss any questions you have with your health care provider. Document Released: 09/27/2005 Document Revised: 03/04/2016 Document Reviewed: 03/12/2014 Elsevier Interactive Patient Education  2017 [employer]

## 2016-09-30 NOTE — Progress Notes (Signed)
09/30/2016  History of Present Illness: Taylor Mack is a 82 y.o. female who was admitted to the hospital on 12/13 with an episode of diverticulitis with a possible very small abscess. She was discharged to her nursing facility on 12/16 after successful conservative management. She still taking her antibiotics. She reports no significant pain with no pain with meals, no nausea, and no vomiting. She also denies having any fevers or chills. She does describe a little bit of discomfort on the left lower quadrant was significantly better compared to her admission symptoms.  Past Medical History: Past Medical History:  Diagnosis Date  . Alzheimer's dementia   . Pancreatitis   . Stroke (HCC)   . Vascular dementia      Past Surgical History: Past Surgical History:  Procedure Laterality Date  . ABDOMINAL HYSTERECTOMY    . APPENDECTOMY    . CHOLECYSTECTOMY      Home Medications: Prior to Admission medications   Medication Sig Start Date End Date Taking? Authorizing Provider  amoxicillin-clavulanate (AUGMENTIN) 875-125 MG tablet Take 1 tablet by mouth 2 (two) times daily. 09/25/16  Yes Gladis Riffleatherine L Loflin, MD  atorvastatin (LIPITOR) 20 MG tablet Take 20 mg by mouth daily.   Yes Historical Provider, MD  calcium-vitamin D (OSCAL WITH D) 500-200 MG-UNIT per tablet Take 0.5 tablets by mouth daily.   Yes Historical Provider, MD  clopidogrel (PLAVIX) 75 MG tablet Take 75 mg by mouth daily.   Yes Historical Provider, MD  divalproex (DEPAKOTE SPRINKLE) 125 MG capsule Take 250 mg by mouth 3 (three) times daily.   Yes Historical Provider, MD  docusate sodium (COLACE) 100 MG capsule Take 100 mg by mouth 2 (two) times daily.   Yes Historical Provider, MD  donepezil (ARICEPT) 10 MG tablet Take 10 mg by mouth at bedtime.   Yes Historical Provider, MD  Melatonin 5 MG TABS Take 1 tablet by mouth at bedtime.   Yes Historical Provider, MD  metoprolol succinate (TOPROL-XL) 25 MG 24 hr tablet Take 12.5 mg by  mouth daily.   Yes Historical Provider, MD  pantoprazole (PROTONIX) 40 MG tablet Take 40 mg by mouth daily.   Yes Historical Provider, MD  QUEtiapine (SEROQUEL) 25 MG tablet Take 1 tablet (25 mg total) by mouth at bedtime. 09/13/15  Yes Darien Ramusavid W Kaminski, MD  sertraline (ZOLOFT) 50 MG tablet Take 50 mg by mouth at bedtime.   Yes Historical Provider, MD  silver sulfADIAZINE (SILVADENE) 1 % cream Apply 1 application topically daily.   Yes Historical Provider, MD  Vitamin D, Ergocalciferol, (DRISDOL) 50000 UNITS CAPS capsule Take 50,000 Units by mouth every 7 (seven) days.   Yes Historical Provider, MD    Allergies: Allergies  Allergen Reactions  . Codeine Nausea And Vomiting  . Latex Other (See Comments)    unknown    Social History:  reports that she has never smoked. She has never used smokeless tobacco. She reports that she does not drink alcohol or use drugs.   Family History: History reviewed. No pertinent family history.  Review of Systems: Review of Systems  Constitutional: Negative for chills and fever.  HENT: Negative for hearing loss.   Eyes: Negative for blurred vision.  Respiratory: Negative for cough and shortness of breath.   Cardiovascular: Negative for chest pain and leg swelling.  Gastrointestinal: Negative for abdominal pain, blood in stool, constipation, diarrhea, heartburn, nausea and vomiting.  Genitourinary: Negative for dysuria.  Musculoskeletal: Negative for myalgias.  Skin: Negative for rash.  Neurological:  Negative for dizziness.  Psychiatric/Behavioral: Negative for depression.  All other systems reviewed and are negative.   Physical Exam BP 131/67   Pulse (!) 57   Temp 97.9 F (36.6 C) (Oral)   Ht 5\' 2"  (1.575 m)   Wt 74.8 kg (165 lb)   BMI 30.18 kg/m  CONSTITUTIONAL: No acute distress HEENT:  Normocephalic, atraumatic, extraocular motion intact. NECK: Trachea is midline, and there is no jugular venous distension.  RESPIRATORY:  Lungs are  clear bilaterally with no respiratory distress CARDIOVASCULAR: Regular rhythm and rate  GI: The abdomen is soft, nondistended, with minimal discomfort over the left lower quadrant to deep palpation. There were no palpable masses.  MUSCULOSKELETAL:  Normal muscle strength and tone in all four extremities.  No peripheral edema or cyanosis. SKIN: Skin turgor is normal. There are no pathologic skin lesions.  NEUROLOGIC:  Motor and sensation is grossly normal.  Cranial nerves are grossly intact. PSYCH:  Affect is normal.  Assessment and Plan: This is a 82 y.o. female presenting after being hospitalized for an episode of acute diverticulitis with a possible very small abscess. She continues to improve and only has minimal discomfort over the left lower quadrant.  -The patient and her son don't exactly remember when her last colonoscopy was but they think it was more than 5 years ago. At this point recommended that after 6-8 weeks that she should have a follow-up colonoscopy to make sure there is no evidence of malignancy that could've caused this episode. They would like to meet with the gastric urologist to discuss bowel prep options as well as possible overnight admission the night before given that she is in a nursing home and has some vascular dementia. We will set her up with her gastroenterology team for further discussion on this. -Otherwise from a surgical standpoint she may follow-up on an as-needed basis pending her colonoscopy results and she should continue her antibiotic course. Given the low severity of her diverticulitis episode, there is no pressing need for surgery in the future.  Have recommended that she start a high fiber diet with plenty of fluids and otherwise there are no other dietary restrictions.    Howie IllJose Luis Antwain Caliendo, MD [facility]

## 2016-11-01 ENCOUNTER — Encounter: Payer: Self-pay | Admitting: Gastroenterology

## 2016-11-01 ENCOUNTER — Other Ambulatory Visit: Payer: Self-pay

## 2016-11-01 ENCOUNTER — Ambulatory Visit (INDEPENDENT_AMBULATORY_CARE_PROVIDER_SITE_OTHER): Payer: MEDICARE | Admitting: Gastroenterology

## 2016-11-01 VITALS — BP 134/68 | HR 54 | Temp 98.3°F | Ht 62.0 in | Wt 166.5 lb

## 2016-11-01 DIAGNOSIS — K5732 Diverticulitis of large intestine without perforation or abscess without bleeding: Secondary | ICD-10-CM

## 2016-11-01 NOTE — Progress Notes (Signed)
Gastroenterology Consultation  Referring Provider:     Dr. Aleen CampiPiscoya Primary Care Physician:  Pcp Not In System Primary Gastroenterologist:  Dr. Servando SnareWohl     Reason for Consultation:     Diverticulitis        HPI:   Taylor Mack is a 83 y.o. y/o female referred for consultation & management of Diverticulitis by Dr. Oneita HurtPcp Not In System.  This patient comes today with a history of diverticulitis. The patient was seen by surgery and sent to me for evaluation due to not having a colonoscopy at some time. The patient is not having any abdominal pain at the present time and comes with her son. The patient lives at a memory facility due to her dementia. There is no report of any abdominal pain nausea vomiting fevers chills. The patient also denies any black stools or bloody stools. She is quite pleasant and communicative. The patient's admission to the hospital was back in December 13 of last year for the diverticulitis.  Past Medical History:  Diagnosis Date  . Alzheimer's dementia   . Pancreatitis   . Stroke (HCC)   . Vascular dementia     Past Surgical History:  Procedure Laterality Date  . ABDOMINAL HYSTERECTOMY    . APPENDECTOMY    . CHOLECYSTECTOMY      Prior to Admission medications   Medication Sig Start Date End Date Taking? Authorizing Provider  atorvastatin (LIPITOR) 20 MG tablet Take 20 mg by mouth daily.   Yes Historical Provider, MD  busPIRone (BUSPAR) 7.5 MG tablet  10/28/16  Yes Historical Provider, MD  calcium-vitamin D (OSCAL WITH D) 500-200 MG-UNIT per tablet Take 0.5 tablets by mouth daily.   Yes Historical Provider, MD  clopidogrel (PLAVIX) 75 MG tablet Take 75 mg by mouth daily.   Yes Historical Provider, MD  divalproex (DEPAKOTE SPRINKLE) 125 MG capsule Take 250 mg by mouth 3 (three) times daily.   Yes Historical Provider, MD  docusate sodium (COLACE) 100 MG capsule Take 100 mg by mouth 2 (two) times daily.   Yes Historical Provider, MD  donepezil (ARICEPT) 10 MG tablet  Take 10 mg by mouth at bedtime.   Yes Historical Provider, MD  Melatonin 5 MG TABS Take 1 tablet by mouth at bedtime.   Yes Historical Provider, MD  metoprolol succinate (TOPROL-XL) 25 MG 24 hr tablet Take 12.5 mg by mouth daily.   Yes Historical Provider, MD  pantoprazole (PROTONIX) 40 MG tablet Take 40 mg by mouth daily.   Yes Historical Provider, MD  QUEtiapine (SEROQUEL) 25 MG tablet Take 1 tablet (25 mg total) by mouth at bedtime. 09/13/15  Yes Darien Ramusavid W Kaminski, MD  sertraline (ZOLOFT) 50 MG tablet Take 50 mg by mouth at bedtime.   Yes Historical Provider, MD  silver sulfADIAZINE (SILVADENE) 1 % cream Apply 1 application topically daily.   Yes Historical Provider, MD  Vitamin D, Ergocalciferol, (DRISDOL) 50000 UNITS CAPS capsule Take 50,000 Units by mouth every 7 (seven) days.   Yes Historical Provider, MD  amoxicillin-clavulanate (AUGMENTIN) 875-125 MG tablet Take 1 tablet by mouth 2 (two) times daily. Patient not taking: Reported on 11/01/2016 09/25/16   Gladis Riffleatherine L Loflin, MD  escitalopram (LEXAPRO) 10 MG tablet  10/18/16   Historical Provider, MD  traZODone (DESYREL) 50 MG tablet  10/18/16   Historical Provider, MD    History reviewed. No pertinent family history.   Social History  Substance Use Topics  . Smoking status: Never Smoker  . Smokeless  tobacco: Never Used  . Alcohol use No    Allergies as of 11/01/2016 - Review Complete 11/01/2016  Allergen Reaction Noted  . Codeine Nausea And Vomiting 12/02/2014  . Latex Other (See Comments) 12/02/2014    Review of Systems:    All systems reviewed and negative except where noted in HPI.   Physical Exam:  BP 134/68   Pulse (!) 54   Temp 98.3 F (36.8 C) (Oral)   Ht 5\' 2"  (1.575 m)   Wt 166 lb 8 oz (75.5 kg)   BMI 30.45 kg/m  No LMP recorded. Patient has had a hysterectomy. Psych:  Alert and cooperative. Normal mood and affect. General:   Alert,  Well-developed, well-nourished, pleasant and cooperative in NAD Head:   Normocephalic and atraumatic. Eyes:  Sclera clear, no icterus.   Conjunctiva pink. Ears:  Normal auditory acuity. Nose:  No deformity, discharge, or lesions. Mouth:  No deformity or lesions,oropharynx pink & moist. Neck:  Supple; no masses or thyromegaly. Lungs:  Respirations even and unlabored.  Clear throughout to auscultation.   No wheezes, crackles, or rhonchi. No acute distress. Heart:  Regular rate and rhythm; no murmurs, clicks, rubs, or gallops. Abdomen:  Normal bowel sounds.  No bruits.  Soft, non-tender and non-distended without masses, hepatosplenomegaly or hernias noted.  No guarding or rebound tenderness.  Negative Carnett sign.   Rectal:  Deferred.  Msk:  Symmetrical without gross deformities.  Good, equal movement & strength bilaterally. Pulses:  Normal pulses noted. Extremities:  No clubbing or edema.  No cyanosis. Neurologic:  Alert and oriented x3;  grossly normal neurologically. Skin:  Intact without significant lesions or rashes.  No jaundice. Lymph Nodes:  No significant cervical adenopathy. Psych:  Alert and cooperative. Normal mood and affect.  Imaging Studies: No results found.  Assessment and Plan:   Taylor Mack is a 83 y.o. y/o female who comes in today with a history of diverticulitis. The patient has not had a colonoscopy in many years. The patient's son states that he believes it may be very hard to give her the prep for a colonoscopy. He inquired whether she could be admitted for the colonic preparation prior to the procedure. The patient's son was told that it is unlikely that her hospital admission would be covered and that she can take a low volume prep at home with some assistance from family members and that would be asked for her. He has been given a sample of the preparation for cleansing prior to the colonoscopy and will set up a time and date for the colonoscopy after talking to the care facility.  Midge Minium, MD. Clementeen Graham   Note: This dictation was  prepared with Dragon dictation along with smaller phrase technology. Any transcriptional errors that result from this process are unintentional.

## 2016-11-03 ENCOUNTER — Telehealth: Payer: Self-pay | Admitting: Gastroenterology

## 2016-11-03 NOTE — Telephone Encounter (Signed)
Call patients son [phone] [name]. He has some concerns regarding a colonoscopy for his mom. Please call him today if you can.

## 2016-11-03 NOTE — Telephone Encounter (Signed)
LVM for pt to return my call.

## 2016-11-04 NOTE — Telephone Encounter (Signed)
Spoke with pt's son, [name]. After speaking with the facility his mother lives in and thinking about her mental status he feels he should hold off on scheduling her for the colonoscopy. She isn't currently having any issues. Advised him if anything changes, please contact our office right away and we will schedule.

## 2016-11-17 ENCOUNTER — Emergency Department: Payer: MEDICARE

## 2016-11-17 ENCOUNTER — Inpatient Hospital Stay
Admission: EM | Admit: 2016-11-17 | Discharge: 2016-11-21 | DRG: 481 | Disposition: A | Discharge status: Skilled Nursing Facility | Payer: MEDICARE | Attending: Internal Medicine | Admitting: Internal Medicine

## 2016-11-17 DIAGNOSIS — Y92122 Bedroom in nursing home as the place of occurrence of the external cause: Secondary | ICD-10-CM

## 2016-11-17 DIAGNOSIS — Z22322 Carrier or suspected carrier of Methicillin resistant Staphylococcus aureus: Secondary | ICD-10-CM | POA: Diagnosis not present

## 2016-11-17 DIAGNOSIS — G309 Alzheimer's disease, unspecified: Secondary | ICD-10-CM | POA: Diagnosis present

## 2016-11-17 DIAGNOSIS — Z9181 History of falling: Secondary | ICD-10-CM

## 2016-11-17 DIAGNOSIS — R262 Difficulty in walking, not elsewhere classified: Secondary | ICD-10-CM

## 2016-11-17 DIAGNOSIS — D62 Acute posthemorrhagic anemia: Secondary | ICD-10-CM | POA: Diagnosis not present

## 2016-11-17 DIAGNOSIS — R030 Elevated blood-pressure reading, without diagnosis of hypertension: Secondary | ICD-10-CM | POA: Diagnosis present

## 2016-11-17 DIAGNOSIS — Z7901 Long term (current) use of anticoagulants: Secondary | ICD-10-CM

## 2016-11-17 DIAGNOSIS — Z7902 Long term (current) use of antithrombotics/antiplatelets: Secondary | ICD-10-CM | POA: Diagnosis not present

## 2016-11-17 DIAGNOSIS — F0390 Unspecified dementia without behavioral disturbance: Secondary | ICD-10-CM | POA: Diagnosis present

## 2016-11-17 DIAGNOSIS — S61210A Laceration without foreign body of right index finger without damage to nail, initial encounter: Secondary | ICD-10-CM | POA: Diagnosis present

## 2016-11-17 DIAGNOSIS — I69354 Hemiplegia and hemiparesis following cerebral infarction affecting left non-dominant side: Secondary | ICD-10-CM

## 2016-11-17 DIAGNOSIS — D649 Anemia, unspecified: Secondary | ICD-10-CM

## 2016-11-17 DIAGNOSIS — W19XXXA Unspecified fall, initial encounter: Secondary | ICD-10-CM | POA: Diagnosis present

## 2016-11-17 DIAGNOSIS — M6281 Muscle weakness (generalized): Secondary | ICD-10-CM

## 2016-11-17 DIAGNOSIS — Z9889 Other specified postprocedural states: Secondary | ICD-10-CM

## 2016-11-17 DIAGNOSIS — Z6830 Body mass index (BMI) 30.0-30.9, adult: Secondary | ICD-10-CM | POA: Diagnosis not present

## 2016-11-17 DIAGNOSIS — S72142A Displaced intertrochanteric fracture of left femur, initial encounter for closed fracture: Principal | ICD-10-CM | POA: Diagnosis present

## 2016-11-17 DIAGNOSIS — M25552 Pain in left hip: Secondary | ICD-10-CM | POA: Diagnosis present

## 2016-11-17 DIAGNOSIS — E876 Hypokalemia: Secondary | ICD-10-CM | POA: Diagnosis present

## 2016-11-17 DIAGNOSIS — Z79899 Other long term (current) drug therapy: Secondary | ICD-10-CM | POA: Diagnosis not present

## 2016-11-17 DIAGNOSIS — Z885 Allergy status to narcotic agent status: Secondary | ICD-10-CM

## 2016-11-17 DIAGNOSIS — Z91040 Latex allergy status: Secondary | ICD-10-CM

## 2016-11-17 DIAGNOSIS — N289 Disorder of kidney and ureter, unspecified: Secondary | ICD-10-CM | POA: Diagnosis present

## 2016-11-17 DIAGNOSIS — Z7984 Long term (current) use of oral hypoglycemic drugs: Secondary | ICD-10-CM

## 2016-11-17 DIAGNOSIS — Z8673 Personal history of transient ischemic attack (TIA), and cerebral infarction without residual deficits: Secondary | ICD-10-CM

## 2016-11-17 DIAGNOSIS — F0150 Vascular dementia without behavioral disturbance: Secondary | ICD-10-CM | POA: Diagnosis present

## 2016-11-17 DIAGNOSIS — E669 Obesity, unspecified: Secondary | ICD-10-CM | POA: Diagnosis present

## 2016-11-17 DIAGNOSIS — Z419 Encounter for procedure for purposes other than remedying health state, unspecified: Secondary | ICD-10-CM

## 2016-11-17 DIAGNOSIS — S72002A Fracture of unspecified part of neck of left femur, initial encounter for closed fracture: Secondary | ICD-10-CM | POA: Diagnosis present

## 2016-11-17 LAB — BASIC METABOLIC PANEL
Anion gap: 11 (ref 5–15)
BUN: 20 mg/dL (ref 6–20)
CALCIUM: 9.1 mg/dL (ref 8.9–10.3)
CHLORIDE: 103 mmol/L (ref 101–111)
CO2: 22 mmol/L (ref 22–32)
CREATININE: 1.12 mg/dL — AB (ref 0.44–1.00)
GFR calc Af Amer: 52 mL/min — ABNORMAL LOW (ref >60)
GFR calc non Af Amer: 45 mL/min — ABNORMAL LOW (ref >60)
Glucose, Bld: 123 mg/dL — ABNORMAL HIGH (ref 65–99)
Potassium: 3.3 mmol/L — ABNORMAL LOW (ref 3.5–5.1)
SODIUM: 136 mmol/L (ref 135–145)

## 2016-11-17 LAB — URINALYSIS, ROUTINE W REFLEX MICROSCOPIC
BILIRUBIN URINE: NEGATIVE
Glucose, UA: NEGATIVE mg/dL
HGB URINE DIPSTICK: NEGATIVE
KETONES UR: NEGATIVE mg/dL
Leukocytes, UA: NEGATIVE
Nitrite: NEGATIVE
PH: 7 (ref 5.0–8.0)
Protein, ur: NEGATIVE mg/dL
SPECIFIC GRAVITY, URINE: 1.014 (ref 1.005–1.030)

## 2016-11-17 LAB — CBC
HEMATOCRIT: 37.3 % (ref 35.0–47.0)
HEMOGLOBIN: 13.4 g/dL (ref 12.0–16.0)
MCH: 31.3 pg (ref 26.0–34.0)
MCHC: 35.8 g/dL (ref 32.0–36.0)
MCV: 87.4 fL (ref 80.0–100.0)
Platelets: 159 10*3/uL (ref 150–440)
RBC: 4.27 MIL/uL (ref 3.80–5.20)
RDW: 13.6 % (ref 11.5–14.5)
WBC: 7.1 10*3/uL (ref 3.6–11.0)

## 2016-11-17 MED ORDER — FENTANYL CITRATE (PF) 100 MCG/2ML IJ SOLN
100.0000 ug | Freq: Once | INTRAMUSCULAR | Status: AC
  Administered 2016-11-17: 100 ug via INTRAVENOUS

## 2016-11-17 MED ORDER — POTASSIUM CHLORIDE CRYS ER 20 MEQ PO TBCR
20.0000 meq | EXTENDED_RELEASE_TABLET | ORAL | Status: DC
Start: 2016-11-17 — End: 2016-11-18

## 2016-11-17 MED ORDER — ONDANSETRON HCL 4 MG/2ML IJ SOLN
4.0000 mg | Freq: Once | INTRAMUSCULAR | Status: AC
  Administered 2016-11-17: 4 mg via INTRAVENOUS
  Filled 2016-11-17: qty 2

## 2016-11-17 MED ORDER — FENTANYL CITRATE (PF) 100 MCG/2ML IJ SOLN
INTRAMUSCULAR | Status: AC
  Administered 2016-11-17: 100 ug via INTRAVENOUS
  Filled 2016-11-17: qty 2

## 2016-11-17 MED ORDER — CEFAZOLIN SODIUM-DEXTROSE 2-4 GM/100ML-% IV SOLN
2.0000 g | INTRAVENOUS | Status: DC
  Filled 2016-11-17 (×2): qty 100

## 2016-11-17 MED ORDER — FENTANYL CITRATE (PF) 100 MCG/2ML IJ SOLN
INTRAMUSCULAR | Status: AC
  Filled 2016-11-17: qty 2

## 2016-11-17 NOTE — ED Notes (Signed)
Report off to iris rn  

## 2016-11-17 NOTE — ED Notes (Signed)
Patient transported to X-ray 

## 2016-11-17 NOTE — ED Provider Notes (Signed)
[facility] Emergency Department Provider Note  Time seen: 9:09 PM  I have reviewed the triage vital signs and the nursing notes.   HISTORY  Chief Complaint Leg Pain and Fall    HPI Taylor Mack is a 83 y.o. female with a past medical history of Alzheimer's disease, vascular dementia, presents to the emergency department after a fall with left hip pain. According to the patient she was at her nursing facility when she had a fall this evening around 8 PM. Son states this is the third fall the patient had over the past 2 days. Patient presents with significant left hip pain, with a shortened and externally rotated left lower extremity. Patient states severe pain in the left hip, denies any other injuries at this time.  Past Medical History:  Diagnosis Date  . Alzheimer's dementia   . Pancreatitis   . Stroke (HCC)   . Vascular dementia     Patient Active Problem List   Diagnosis Date Noted  . Diverticulitis of large intestine with abscess without bleeding 09/22/2016  . Pancreatitis, acute 12/02/2014  . Dementia 12/02/2014  . History of stroke 12/02/2014  . Acute pancreatitis 12/02/2014    Past Surgical History:  Procedure Laterality Date  . ABDOMINAL HYSTERECTOMY    . APPENDECTOMY    . CHOLECYSTECTOMY      Prior to Admission medications   Medication Sig Start Date End Date Taking? Authorizing Provider  amoxicillin-clavulanate (AUGMENTIN) 875-125 MG tablet Take 1 tablet by mouth 2 (two) times daily. Patient not taking: Reported on 11/01/2016 09/25/16   Gladis Riffle, MD  atorvastatin (LIPITOR) 20 MG tablet Take 20 mg by mouth daily.    Historical Provider, MD  busPIRone (BUSPAR) 7.5 MG tablet  10/28/16   Historical Provider, MD  calcium-vitamin D (OSCAL WITH D) 500-200 MG-UNIT per tablet Take 0.5 tablets by mouth daily.    Historical Provider, MD  clopidogrel (PLAVIX) 75 MG tablet Take 75 mg by mouth daily.    Historical Provider, MD  divalproex  (DEPAKOTE SPRINKLE) 125 MG capsule Take 250 mg by mouth 3 (three) times daily.    Historical Provider, MD  docusate sodium (COLACE) 100 MG capsule Take 100 mg by mouth 2 (two) times daily.    Historical Provider, MD  donepezil (ARICEPT) 10 MG tablet Take 10 mg by mouth at bedtime.    Historical Provider, MD  escitalopram (LEXAPRO) 10 MG tablet  10/18/16   Historical Provider, MD  Melatonin 5 MG TABS Take 1 tablet by mouth at bedtime.    Historical Provider, MD  metoprolol succinate (TOPROL-XL) 25 MG 24 hr tablet Take 12.5 mg by mouth daily.    Historical Provider, MD  pantoprazole (PROTONIX) 40 MG tablet Take 40 mg by mouth daily.    Historical Provider, MD  QUEtiapine (SEROQUEL) 25 MG tablet Take 1 tablet (25 mg total) by mouth at bedtime. 09/13/15   [name], MD  sertraline (ZOLOFT) 50 MG tablet Take 50 mg by mouth at bedtime.    Historical Provider, MD  silver sulfADIAZINE (SILVADENE) 1 % cream Apply 1 application topically daily.    Historical Provider, MD  traZODone (DESYREL) 50 MG tablet  10/18/16   Historical Provider, MD  Vitamin D, Ergocalciferol, (DRISDOL) 50000 UNITS CAPS capsule Take 50,000 Units by mouth every 7 (seven) days.    Historical Provider, MD    Allergies  Allergen Reactions  . Codeine Nausea And Vomiting  . Latex Other (See Comments)  unknown    No family history on file.  Social History Social History  Substance Use Topics  . Smoking status: Never Smoker  . Smokeless tobacco: Never Used  . Alcohol use No    Review of Systems Constitutional: Negative for fever Cardiovascular: Negative for chest pain. Respiratory: Negative for shortness of breath. Gastrointestinal: Negative for abdominal pain Musculoskeletal: Left hip pain Neurological: Negative for headache 10-point ROS otherwise negative, somewhat limited by the patient's history of dementia.  ____________________________________________   PHYSICAL EXAM:  VITAL SIGNS: ED Triage Vitals  Enc  Vitals Group     BP 11/17/16 2043 (!) 206/76     Pulse Rate 11/17/16 2043 62     Resp 11/17/16 2043 (!) 22     Temp 11/17/16 2043 98.4 F (36.9 C)     Temp Source 11/17/16 2043 Oral     SpO2 11/17/16 2043 97 %     Weight 11/17/16 2044 165 lb (74.8 kg)     Height 11/17/16 2044 5\' 4"  (1.626 m)     Head Circumference --      Peak Flow --      Pain Score 11/17/16 2045 10     Pain Loc --      Pain Edu? --      Excl. in GC? --     Constitutional: Alert. Well appearing and in no distress.Acting at her baseline per son. Eyes: Normal exam ENT   Head: Normocephalic and atraumatic.   Mouth/Throat: Mucous membranes are moist. Cardiovascular: Normal rate, regular rhythm. Respiratory: Normal respiratory effort without tachypnea nor retractions. Breath sounds are clear  Gastrointestinal: Soft, mild distention but nontender to palpation. No rebound or guarding. Dull percussion. Musculoskeletal: The patient has a shortened and externally rotated left lower extremity, 2+ DP pulses neurovascular intact able to move her foot and toes. Significant pain with any attempted movement of the left hip. Neurologic:  Normal speech and language. No gross focal neurologic deficits Skin:  Skin is warm, dry and intact.  Psychiatric: Mood and affect are normal.   ____________________________________________    EKG  EKG reviewed and interpreted, so shows normal sinus rhythm at 60 bpm, narrow QRS, normal axis, normal intervals no concerning ST changes.  ____________________________________________    RADIOLOGY  Left intertrochanteric hip fracture.  Chest x-ray shows cardiomegaly without acute abnormality  ____________________________________________   INITIAL IMPRESSION / ASSESSMENT AND PLAN / ED COURSE  Pertinent labs & imaging results that were available during my care of the patient were reviewed by me and considered in my medical decision making (see chart for details).  The patient  presents to the emergent department with left hip pain worse with any movement after a fall tonight. Patient has a shortened and externally rotated left lower extremity most consistent with fracture. We'll obtain images of the hip and pelvis, treat pain, obtain labs chest x-ray and EKG in anticipation of operation being required.  X-ray consistent with intertrochanteric left hip fracture. Labs are largely within normal limits. I discussed the patient with Dr.Poggi. We'll admit the patient to the hospital for further treatment and medical clearance.  ____________________________________________   FINAL CLINICAL IMPRESSION(S) / ED DIAGNOSES  Left hip fracture    Minna AntisKevin Din Bookwalter, MD 11/17/16 [phone]

## 2016-11-17 NOTE — ED Notes (Signed)
Pain meds given.  Family with pt.  Foley inserted without diff.  ua to lab.  siderails up x 2.

## 2016-11-17 NOTE — ED Triage Notes (Signed)
Per EMS, [facility] and fell on chair then to the floor. EMS arrived and pt was upset and stating her "left leg hurts." Pt states she is not able to move left leg however is able to move toes. Pt has a hx of right leg drag per EMS. Pt able to answer questions at this time.

## 2016-11-17 NOTE — H&P (Signed)
[facility] [place] at [facility]   PATIENT NAME: Taylor Mack    MR#:  [id number]  DATE OF BIRTH:  [birth year]  DATE OF ADMISSION:  11/17/2016  PRIMARY CARE PHYSICIAN: Pcp Not In System   REQUESTING/REFERRING PHYSICIAN: Lenard Lance, MD  CHIEF COMPLAINT:   Chief Complaint  Patient presents with  . Leg Pain  . Fall    HISTORY OF PRESENT ILLNESS:  Taylor Mack  is a 83 y.o. female who presents with Fall and subsequent left hip fracture. Patient is in a memory care unit due to dementia and cannot contribute to her history of present illness. History is taken from her son who is at bedside with her. Son states he was called by the nursing facility and told that she had a fall, but seemed to be okay and was back in bed the planned physician visit for tomorrow. He was called in later and told that she was found to Out of bed on the floor, this time complaining of her left hip hurting. Here in the ED x-ray imaging confirmed left hip fracture.  PAST MEDICAL HISTORY:   Past Medical History:  Diagnosis Date  . Alzheimer's dementia   . Pancreatitis   . Stroke (HCC)   . Vascular dementia     PAST SURGICAL HISTORY:   Past Surgical History:  Procedure Laterality Date  . ABDOMINAL HYSTERECTOMY    . APPENDECTOMY    . CHOLECYSTECTOMY      SOCIAL HISTORY:   Social History  Substance Use Topics  . Smoking status: Never Smoker  . Smokeless tobacco: Never Used  . Alcohol use No    FAMILY HISTORY:   Family History  Problem Relation Age of Onset  . Family history unknown: Yes    DRUG ALLERGIES:   Allergies  Allergen Reactions  . Codeine Nausea And Vomiting  . Latex Other (See Comments)    unknown    MEDICATIONS AT HOME:   Prior to Admission medications   Medication Sig Start Date End Date Taking? Authorizing Provider  acetaminophen (TYLENOL) 500 MG tablet Take 500 mg by mouth every 6 (six) hours as needed.   Yes Historical Provider, MD   atorvastatin (LIPITOR) 20 MG tablet Take 20 mg by mouth daily.   Yes Historical Provider, MD  calcium-vitamin D (OSCAL WITH D) 500-200 MG-UNIT per tablet Take 0.5 tablets by mouth daily.   Yes Historical Provider, MD  clopidogrel (PLAVIX) 75 MG tablet Take 75 mg by mouth daily.   Yes Historical Provider, MD  divalproex (DEPAKOTE SPRINKLE) 125 MG capsule Take 250 mg by mouth 3 (three) times daily.   Yes Historical Provider, MD  docusate sodium (COLACE) 100 MG capsule Take 100 mg by mouth 2 (two) times daily.   Yes Historical Provider, MD  donepezil (ARICEPT) 10 MG tablet Take 10 mg by mouth at bedtime.   Yes Historical Provider, MD  loperamide (IMODIUM) 2 MG capsule Take 2 mg by mouth as needed for diarrhea or loose stools.   Yes Historical Provider, MD  Melatonin 5 MG TABS Take 1 tablet by mouth at bedtime.   Yes Historical Provider, MD  metoprolol succinate (TOPROL-XL) 25 MG 24 hr tablet Take 12.5 mg by mouth daily.   Yes Historical Provider, MD  pantoprazole (PROTONIX) 40 MG tablet Take 40 mg by mouth daily.   Yes Historical Provider, MD  psyllium (HYDROCIL/METAMUCIL) 95 % PACK Take 1 packet by mouth daily.   Yes Historical Provider, MD  sertraline (ZOLOFT) 50  MG tablet Take 50 mg by mouth at bedtime.   Yes Historical Provider, MD  Vitamin D, Ergocalciferol, (DRISDOL) 50000 UNITS CAPS capsule Take 50,000 Units by mouth every 7 (seven) days.   Yes Historical Provider, MD  amoxicillin-clavulanate (AUGMENTIN) 875-125 MG tablet Take 1 tablet by mouth 2 (two) times daily. Patient not taking: Reported on 11/01/2016 09/25/16   Gladis Riffle, MD  busPIRone (BUSPAR) 7.5 MG tablet  10/28/16   Historical Provider, MD  escitalopram (LEXAPRO) 10 MG tablet  10/18/16   Historical Provider, MD  QUEtiapine (SEROQUEL) 25 MG tablet Take 1 tablet (25 mg total) by mouth at bedtime. Patient not taking: Reported on 11/17/2016 09/13/15   [name], MD  silver sulfADIAZINE (SILVADENE) 1 % cream Apply 1  application topically daily.    Historical Provider, MD  traZODone (DESYREL) 50 MG tablet  10/18/16   Historical Provider, MD    REVIEW OF SYSTEMS:  Review of Systems  Unable to perform ROS: Dementia     VITAL SIGNS:   Vitals:   11/17/16 2213 11/17/16 2215 11/17/16 2225 11/17/16 2240  BP:   (!) 150/75 139/68  Pulse: (!) 58 (!) 59  62  Resp: (!) 26 (!) 21 14 16   Temp:      TempSrc:      SpO2: 90% (!) 89%  (!) 78%  Weight:      Height:       Wt Readings from Last 3 Encounters:  11/17/16 74.8 kg (165 lb)  11/01/16 75.5 kg (166 lb 8 oz)  09/30/16 74.8 kg (165 lb)    PHYSICAL EXAMINATION:  Physical Exam  Vitals reviewed. Constitutional: She appears well-developed and well-nourished. No distress.  HENT:  Head: Normocephalic and atraumatic.  Mouth/Throat: Oropharynx is clear and moist.  Eyes: Conjunctivae and EOM are normal. Pupils are equal, round, and reactive to light. No scleral icterus.  Neck: Normal range of motion. Neck supple. No JVD present. No thyromegaly present.  Cardiovascular: Normal rate, regular rhythm and intact distal pulses.  Exam reveals no gallop and no friction rub.   No murmur heard. Respiratory: Effort normal and breath sounds normal. No respiratory distress. She has no wheezes. She has no rales.  GI: Soft. Bowel sounds are normal. She exhibits no distension. There is no tenderness.  Musculoskeletal: Normal range of motion. She exhibits tenderness (left hip). She exhibits no edema.  No arthritis, no gout  Lymphadenopathy:    She has no cervical adenopathy.  Neurological: She is alert.  Unable to fully assess due to patient condition  Skin: Skin is warm and dry. No rash noted. No erythema.  Psychiatric:  Unable to assess due to patient condition    LABORATORY PANEL:   CBC  Recent Labs Lab 11/17/16 2118  WBC 7.1  HGB 13.4  HCT 37.3  PLT 159    ------------------------------------------------------------------------------------------------------------------  Chemistries   Recent Labs Lab 11/17/16 2118  NA 136  K 3.3*  CL 103  CO2 22  GLUCOSE 123*  BUN 20  CREATININE 1.12*  CALCIUM 9.1   ------------------------------------------------------------------------------------------------------------------  Cardiac Enzymes No results for input(s): TROPONINI in the last 168 hours. ------------------------------------------------------------------------------------------------------------------  RADIOLOGY:  Dg Chest 1 View  Result Date: 11/17/2016 CLINICAL DATA:  Fall with hip pain.  Femoral fracture. EXAM: CHEST 1 VIEW COMPARISON:  Chest radiograph 10127 FINDINGS: The cardiomediastinal silhouette is enlarged without pulmonary edema. No focal airspace consolidation. IMPRESSION: Cardiomegaly without pulmonary edema or focal airspace disease. Electronically Signed   By: Deatra Robinson  M.D.   On: 11/17/2016 22:13   Dg Hip Unilat W Or Wo Pelvis 2-3 Views Left  Result Date: 11/17/2016 CLINICAL DATA:  Fall with hip pain EXAM: DG HIP (WITH OR WITHOUT PELVIS) 2-3V LEFT COMPARISON:  None. FINDINGS: There is a common aided intertrochanteric fracture of the left femur or with medial angulation. The femoral head remains situated within the acetabulum. No other acute abnormality of the visualized pelvis. IMPRESSION: Comminuted, medially angulated intertrochanteric fracture of the left femur. Electronically Signed   By: Deatra RobinsonKevin  Herman M.D.   On: 11/17/2016 22:10    EKG:   Orders placed or performed during the hospital encounter of 11/17/16  . ED EKG  . ED EKG  . EKG 12-Lead  . EKG 12-Lead    IMPRESSION AND PLAN:  Principal Problem:   Closed left hip fracture [facility](HCC) - orthopedic surgery consult and place, I will see the patient tomorrow. Patient is on Pradaxa so is unlikely to undergo operation tomorrow. We'll defer to their  recommendations for further treatment of this problem. Active Problems:   Dementia - continue home meds   History of stroke - patient is on Pradaxa for the same, we will hold this medication for now in anticipation of hip surgery.  All the records are reviewed and case discussed with ED provider. Management plans discussed with the patient and/or family.  DVT PROPHYLAXIS: Mechanical only  GI PROPHYLAXIS: None  ADMISSION STATUS: Inpatient  CODE STATUS: Full Code Status History    Date Active Date Inactive Code Status Order ID Comments User Context   09/22/2016  7:54 PM 09/25/2016  6:23 PM Full Code [id number]  Tiney Rougealph Ely III, MD ED   12/02/2014  6:22 AM 12/03/2014  4:14 PM Full Code [id number]  Hillary BowJared M Gardner, DO Inpatient      TOTAL TIME TAKING CARE OF THIS PATIENT: 45 minutes.    Kevionna Heffler FIELDING 11/17/2016, 11:36 PM  [facility]  Office  [phone]  CC: Primary care physician; Pcp Not In System

## 2016-11-18 ENCOUNTER — Encounter
Admission: EM | Disposition: A | Discharge status: Skilled Nursing Facility | Payer: Self-pay | Source: Home / Self Care | Attending: Internal Medicine

## 2016-11-18 ENCOUNTER — Inpatient Hospital Stay: Payer: MEDICARE | Admitting: Anesthesiology

## 2016-11-18 ENCOUNTER — Inpatient Hospital Stay: Payer: MEDICARE

## 2016-11-18 HISTORY — PX: INTRAMEDULLARY (IM) NAIL INTERTROCHANTERIC: SHX5875

## 2016-11-18 LAB — BASIC METABOLIC PANEL
Anion gap: 7 (ref 5–15)
BUN: 22 mg/dL — AB (ref 6–20)
CHLORIDE: 104 mmol/L (ref 101–111)
CO2: 25 mmol/L (ref 22–32)
CREATININE: 1.11 mg/dL — AB (ref 0.44–1.00)
Calcium: 8.8 mg/dL — ABNORMAL LOW (ref 8.9–10.3)
GFR calc Af Amer: 53 mL/min — ABNORMAL LOW (ref >60)
GFR calc non Af Amer: 45 mL/min — ABNORMAL LOW (ref >60)
GLUCOSE: 147 mg/dL — AB (ref 65–99)
POTASSIUM: 4.1 mmol/L (ref 3.5–5.1)
SODIUM: 136 mmol/L (ref 135–145)

## 2016-11-18 LAB — SURGICAL PCR SCREEN
MRSA, PCR: POSITIVE — AB
Staphylococcus aureus: POSITIVE — AB

## 2016-11-18 LAB — CBC
HEMATOCRIT: 34.1 % — AB (ref 35.0–47.0)
Hemoglobin: 11.9 g/dL — ABNORMAL LOW (ref 12.0–16.0)
MCH: 30.7 pg (ref 26.0–34.0)
MCHC: 34.9 g/dL (ref 32.0–36.0)
MCV: 88 fL (ref 80.0–100.0)
PLATELETS: 145 10*3/uL — AB (ref 150–440)
RBC: 3.88 MIL/uL (ref 3.80–5.20)
RDW: 13.1 % (ref 11.5–14.5)
WBC: 8.5 10*3/uL (ref 3.6–11.0)

## 2016-11-18 SURGERY — FIXATION, FRACTURE, INTERTROCHANTERIC, WITH INTRAMEDULLARY ROD
Anesthesia: General | Laterality: Left

## 2016-11-18 MED ORDER — PANTOPRAZOLE SODIUM 40 MG PO TBEC
40.0000 mg | DELAYED_RELEASE_TABLET | Freq: Every day | ORAL | Status: DC
Start: 2016-11-18 — End: 2016-11-21
  Administered 2016-11-19 – 2016-11-21 (×3): 40 mg via ORAL
  Filled 2016-11-18 (×3): qty 1

## 2016-11-18 MED ORDER — METOCLOPRAMIDE HCL 10 MG PO TABS: 5.0000 mg | ORAL_TABLET | Freq: Three times a day (TID) | ORAL | Status: DC | PRN

## 2016-11-18 MED ORDER — LIDOCAINE HCL (CARDIAC) 20 MG/ML IV SOLN
INTRAVENOUS | Status: DC | PRN
  Administered 2016-11-18: 80 mg via INTRAVENOUS

## 2016-11-18 MED ORDER — LIDOCAINE HCL (PF) 2 % IJ SOLN
INTRAMUSCULAR | Status: AC
  Filled 2016-11-18: qty 2

## 2016-11-18 MED ORDER — ACETAMINOPHEN 650 MG RE SUPP: 650.0000 mg | Freq: Four times a day (QID) | RECTAL | Status: DC | PRN

## 2016-11-18 MED ORDER — BUPIVACAINE-EPINEPHRINE (PF) 0.5% -1:200000 IJ SOLN
INTRAMUSCULAR | Status: DC | PRN
  Administered 2016-11-18: 30 mL

## 2016-11-18 MED ORDER — METOPROLOL SUCCINATE ER 25 MG PO TB24
12.5000 mg | ORAL_TABLET | Freq: Every day | ORAL | Status: DC
Start: 2016-11-18 — End: 2016-11-21
  Administered 2016-11-18 – 2016-11-21 (×4): 12.5 mg via ORAL
  Filled 2016-11-18 (×4): qty 1

## 2016-11-18 MED ORDER — MORPHINE SULFATE (PF) 2 MG/ML IV SOLN: 2.0000 mg | INTRAVENOUS | Status: DC | PRN

## 2016-11-18 MED ORDER — SODIUM CHLORIDE 0.9% FLUSH
3.0000 mL | Freq: Two times a day (BID) | INTRAVENOUS | Status: DC
  Administered 2016-11-18 – 2016-11-21 (×2): 3 mL via INTRAVENOUS

## 2016-11-18 MED ORDER — ACETAMINOPHEN 325 MG PO TABS: 650.0000 mg | ORAL_TABLET | Freq: Four times a day (QID) | ORAL | Status: DC | PRN

## 2016-11-18 MED ORDER — ONDANSETRON HCL 4 MG/2ML IJ SOLN
4.0000 mg | Freq: Four times a day (QID) | INTRAMUSCULAR | Status: DC | PRN
  Administered 2016-11-18 (×2): 4 mg via INTRAVENOUS

## 2016-11-18 MED ORDER — SODIUM CHLORIDE 0.9 % IV SOLN
INTRAVENOUS | Status: DC
  Administered 2016-11-18: 01:00:00 via INTRAVENOUS

## 2016-11-18 MED ORDER — CEFAZOLIN SODIUM-DEXTROSE 2-3 GM-% IV SOLR
2.0000 g | INTRAVENOUS | Status: DC
  Filled 2016-11-18: qty 50

## 2016-11-18 MED ORDER — ACETAMINOPHEN 10 MG/ML IV SOLN
INTRAVENOUS | Status: DC | PRN
  Administered 2016-11-18: 1000 mg via INTRAVENOUS

## 2016-11-18 MED ORDER — OXYCODONE HCL 5 MG PO TABS
5.0000 mg | ORAL_TABLET | ORAL | Status: DC | PRN
  Administered 2016-11-18 (×4): 5 mg via ORAL
  Filled 2016-11-18 (×4): qty 1

## 2016-11-18 MED ORDER — MUPIROCIN 2 % EX OINT
1.0000 "application " | TOPICAL_OINTMENT | Freq: Two times a day (BID) | CUTANEOUS | Status: DC
  Administered 2016-11-18 – 2016-11-21 (×7): 1 via NASAL
  Filled 2016-11-18: qty 22

## 2016-11-18 MED ORDER — NEOMYCIN-POLYMYXIN B GU 40-200000 IR SOLN
Status: DC | PRN
  Administered 2016-11-18: 2 mL

## 2016-11-18 MED ORDER — ENOXAPARIN SODIUM 40 MG/0.4ML ~~LOC~~ SOLN
40.0000 mg | SUBCUTANEOUS | Status: DC
  Administered 2016-11-19 – 2016-11-21 (×3): 40 mg via SUBCUTANEOUS
  Filled 2016-11-18 (×3): qty 0.4

## 2016-11-18 MED ORDER — ACETAMINOPHEN 10 MG/ML IV SOLN
INTRAVENOUS | Status: AC
  Filled 2016-11-18: qty 100

## 2016-11-18 MED ORDER — NEOMYCIN-POLYMYXIN B GU 40-200000 IR SOLN
Status: AC
  Filled 2016-11-18: qty 2

## 2016-11-18 MED ORDER — ROCURONIUM BROMIDE 100 MG/10ML IV SOLN
INTRAVENOUS | Status: DC | PRN
  Administered 2016-11-18: 20 mg via INTRAVENOUS
  Administered 2016-11-18: 10 mg via INTRAVENOUS

## 2016-11-18 MED ORDER — METOCLOPRAMIDE HCL 5 MG/ML IJ SOLN: 5.0000 mg | Freq: Three times a day (TID) | INTRAMUSCULAR | Status: DC | PRN

## 2016-11-18 MED ORDER — MAGNESIUM HYDROXIDE 400 MG/5ML PO SUSP
30.0000 mL | Freq: Every day | ORAL | Status: DC | PRN
  Administered 2016-11-20 – 2016-11-21 (×2): 30 mL via ORAL
  Filled 2016-11-18 (×2): qty 30

## 2016-11-18 MED ORDER — FENTANYL CITRATE (PF) 100 MCG/2ML IJ SOLN
INTRAMUSCULAR | Status: AC
  Filled 2016-11-18: qty 2

## 2016-11-18 MED ORDER — PHENYLEPHRINE HCL 10 MG/ML IJ SOLN
INTRAMUSCULAR | Status: DC | PRN
  Administered 2016-11-18: 100 ug via INTRAVENOUS
  Administered 2016-11-18: 200 ug via INTRAVENOUS
  Administered 2016-11-18 (×3): 100 ug via INTRAVENOUS

## 2016-11-18 MED ORDER — CEFAZOLIN SODIUM-DEXTROSE 2-4 GM/100ML-% IV SOLN
2.0000 g | Freq: Four times a day (QID) | INTRAVENOUS | Status: DC
  Filled 2016-11-18 (×3): qty 100

## 2016-11-18 MED ORDER — VANCOMYCIN HCL IN DEXTROSE 1-5 GM/200ML-% IV SOLN
1000.0000 mg | Freq: Once | INTRAVENOUS | Status: AC
  Administered 2016-11-18: 1000 mg via INTRAVENOUS
  Filled 2016-11-18: qty 200

## 2016-11-18 MED ORDER — CHLORHEXIDINE GLUCONATE CLOTH 2 % EX PADS
6.0000 | MEDICATED_PAD | Freq: Every day | CUTANEOUS | Status: DC
  Administered 2016-11-18 – 2016-11-21 (×2): 6 via TOPICAL

## 2016-11-18 MED ORDER — DIVALPROEX SODIUM 125 MG PO CSDR
250.0000 mg | DELAYED_RELEASE_CAPSULE | Freq: Three times a day (TID) | ORAL | Status: DC
  Administered 2016-11-18 – 2016-11-21 (×8): 250 mg via ORAL
  Filled 2016-11-18 (×8): qty 2

## 2016-11-18 MED ORDER — KCL IN DEXTROSE-NACL 20-5-0.9 MEQ/L-%-% IV SOLN
INTRAVENOUS | Status: DC
  Administered 2016-11-18: 21:00:00 via INTRAVENOUS
  Filled 2016-11-18 (×5): qty 1000

## 2016-11-18 MED ORDER — ATORVASTATIN CALCIUM 20 MG PO TABS
20.0000 mg | ORAL_TABLET | Freq: Every day | ORAL | Status: DC
  Administered 2016-11-19 – 2016-11-21 (×3): 20 mg via ORAL
  Filled 2016-11-18 (×3): qty 1

## 2016-11-18 MED ORDER — BUPIVACAINE-EPINEPHRINE (PF) 0.5% -1:200000 IJ SOLN
INTRAMUSCULAR | Status: AC
  Filled 2016-11-18: qty 30

## 2016-11-18 MED ORDER — GLYCOPYRROLATE 0.2 MG/ML IJ SOLN
INTRAMUSCULAR | Status: DC | PRN
  Administered 2016-11-18: 0.2 mg via INTRAVENOUS

## 2016-11-18 MED ORDER — ONDANSETRON HCL 4 MG/2ML IJ SOLN
INTRAMUSCULAR | Status: AC
  Filled 2016-11-18: qty 2

## 2016-11-18 MED ORDER — FENTANYL CITRATE (PF) 100 MCG/2ML IJ SOLN: 25.0000 ug | INTRAMUSCULAR | Status: DC | PRN

## 2016-11-18 MED ORDER — CEFAZOLIN SODIUM-DEXTROSE 2-3 GM-% IV SOLR
2.0000 g | Freq: Four times a day (QID) | INTRAVENOUS | Status: AC
  Administered 2016-11-18 – 2016-11-19 (×3): 2 g via INTRAVENOUS
  Filled 2016-11-18 (×3): qty 50

## 2016-11-18 MED ORDER — DOCUSATE SODIUM 100 MG PO CAPS
100.0000 mg | ORAL_CAPSULE | Freq: Two times a day (BID) | ORAL | Status: DC
  Administered 2016-11-18 – 2016-11-21 (×6): 100 mg via ORAL
  Filled 2016-11-18 (×6): qty 1

## 2016-11-18 MED ORDER — DEXAMETHASONE SODIUM PHOSPHATE 10 MG/ML IJ SOLN
INTRAMUSCULAR | Status: DC | PRN
  Administered 2016-11-18: 10 mg via INTRAVENOUS

## 2016-11-18 MED ORDER — MEPERIDINE HCL 25 MG/ML IJ SOLN: 6.2500 mg | INTRAMUSCULAR | Status: DC | PRN

## 2016-11-18 MED ORDER — SUCCINYLCHOLINE CHLORIDE 20 MG/ML IJ SOLN
INTRAMUSCULAR | Status: DC | PRN
  Administered 2016-11-18: 80 mg via INTRAVENOUS

## 2016-11-18 MED ORDER — ROCURONIUM BROMIDE 50 MG/5ML IV SOLN
INTRAVENOUS | Status: AC
  Filled 2016-11-18: qty 1

## 2016-11-18 MED ORDER — ACETAMINOPHEN 500 MG PO TABS
1000.0000 mg | ORAL_TABLET | Freq: Four times a day (QID) | ORAL | Status: AC
  Administered 2016-11-18 – 2016-11-19 (×4): 1000 mg via ORAL
  Filled 2016-11-18 (×4): qty 2

## 2016-11-18 MED ORDER — DIPHENHYDRAMINE HCL 12.5 MG/5ML PO ELIX: 12.5000 mg | ORAL_SOLUTION | ORAL | Status: DC | PRN

## 2016-11-18 MED ORDER — ONDANSETRON HCL 4 MG PO TABS: 4.0000 mg | ORAL_TABLET | Freq: Four times a day (QID) | ORAL | Status: DC | PRN

## 2016-11-18 MED ORDER — SERTRALINE HCL 50 MG PO TABS
50.0000 mg | ORAL_TABLET | Freq: Every day | ORAL | Status: DC
  Administered 2016-11-18 – 2016-11-20 (×3): 50 mg via ORAL
  Filled 2016-11-18 (×3): qty 1

## 2016-11-18 MED ORDER — OXYCODONE HCL 5 MG PO TABS
5.0000 mg | ORAL_TABLET | ORAL | Status: DC | PRN
  Administered 2016-11-19 – 2016-11-21 (×5): 5 mg via ORAL
  Filled 2016-11-18 (×5): qty 1

## 2016-11-18 MED ORDER — PROPOFOL 10 MG/ML IV BOLUS
INTRAVENOUS | Status: AC
  Filled 2016-11-18: qty 20

## 2016-11-18 MED ORDER — ACETAMINOPHEN 325 MG PO TABS
650.0000 mg | ORAL_TABLET | Freq: Four times a day (QID) | ORAL | Status: DC | PRN
  Filled 2016-11-18: qty 2

## 2016-11-18 MED ORDER — FLEET ENEMA 7-19 GM/118ML RE ENEM: 1.0000 | ENEMA | Freq: Once | RECTAL | Status: DC | PRN

## 2016-11-18 MED ORDER — ONDANSETRON HCL 4 MG/2ML IJ SOLN: 4.0000 mg | Freq: Four times a day (QID) | INTRAMUSCULAR | Status: DC | PRN

## 2016-11-18 MED ORDER — PROPOFOL 10 MG/ML IV BOLUS
INTRAVENOUS | Status: DC | PRN
  Administered 2016-11-18: 50 mg via INTRAVENOUS
  Administered 2016-11-18: 30 mg via INTRAVENOUS

## 2016-11-18 MED ORDER — FENTANYL CITRATE (PF) 100 MCG/2ML IJ SOLN
INTRAMUSCULAR | Status: DC | PRN
  Administered 2016-11-18: 50 ug via INTRAVENOUS
  Administered 2016-11-18 (×2): 25 ug via INTRAVENOUS

## 2016-11-18 MED ORDER — SEVOFLURANE IN SOLN
RESPIRATORY_TRACT | Status: AC
  Filled 2016-11-18: qty 250

## 2016-11-18 MED ORDER — DONEPEZIL HCL 5 MG PO TABS
10.0000 mg | ORAL_TABLET | Freq: Every day | ORAL | Status: DC
  Administered 2016-11-18 – 2016-11-20 (×3): 10 mg via ORAL
  Filled 2016-11-18 (×3): qty 2

## 2016-11-18 MED ORDER — PROMETHAZINE HCL 25 MG/ML IJ SOLN: 6.2500 mg | INTRAMUSCULAR | Status: DC | PRN

## 2016-11-18 MED ORDER — BISACODYL 10 MG RE SUPP
10.0000 mg | Freq: Every day | RECTAL | Status: DC | PRN
  Administered 2016-11-21: 10 mg via RECTAL
  Filled 2016-11-18 (×2): qty 1

## 2016-11-18 MED ORDER — SODIUM CHLORIDE 0.9 % IV SOLN
INTRAVENOUS | Status: DC | PRN
  Administered 2016-11-18: 17:00:00 via INTRAVENOUS

## 2016-11-18 MED ORDER — ONDANSETRON HCL 4 MG/2ML IJ SOLN
4.0000 mg | Freq: Once | INTRAMUSCULAR | Status: DC
  Filled 2016-11-18: qty 2

## 2016-11-18 MED ORDER — SUGAMMADEX SODIUM 200 MG/2ML IV SOLN
INTRAVENOUS | Status: DC | PRN
  Administered 2016-11-18: 150 mg via INTRAVENOUS

## 2016-11-18 MED ORDER — DEXAMETHASONE SODIUM PHOSPHATE 10 MG/ML IJ SOLN
INTRAMUSCULAR | Status: AC
  Filled 2016-11-18: qty 1

## 2016-11-18 MED ORDER — SUCCINYLCHOLINE CHLORIDE 20 MG/ML IJ SOLN
INTRAMUSCULAR | Status: AC
  Filled 2016-11-18: qty 1

## 2016-11-18 MED ORDER — FENTANYL CITRATE (PF) 100 MCG/2ML IJ SOLN
25.0000 ug | Freq: Once | INTRAMUSCULAR | Status: AC
  Administered 2016-11-18: 25 ug via INTRAVENOUS

## 2016-11-18 MED ORDER — VANCOMYCIN HCL IN DEXTROSE 1-5 GM/200ML-% IV SOLN: 1000.0000 mg | Freq: Two times a day (BID) | INTRAVENOUS | Status: AC

## 2016-11-18 SURGICAL SUPPLY — 39 items
BIT DRILL 4.3MMS DISTAL GRDTED (BIT) ×1 IMPLANT
BNDG COHESIVE 4X5 TAN STRL (GAUZE/BANDAGES/DRESSINGS) ×3 IMPLANT
BNDG COHESIVE 6X5 TAN STRL LF (GAUZE/BANDAGES/DRESSINGS) ×3 IMPLANT
CANISTER SUCT 1200ML W/VALVE (MISCELLANEOUS) ×3 IMPLANT
CHLORAPREP W/TINT 26ML (MISCELLANEOUS) ×3 IMPLANT
DRAPE C-ARMOR (DRAPES) ×3 IMPLANT
DRAPE SHEET LG 3/4 BI-LAMINATE (DRAPES) ×3 IMPLANT
DRILL 4.3MMS DISTAL GRADUATED (BIT) ×3
DRSG OPSITE POSTOP 3X4 (GAUZE/BANDAGES/DRESSINGS) ×9 IMPLANT
DRSG OPSITE POSTOP 4X6 (GAUZE/BANDAGES/DRESSINGS) IMPLANT
ELECT CAUTERY BLADE 6.4 (BLADE) ×3 IMPLANT
ELECT REM PT RETURN 9FT ADLT (ELECTROSURGICAL) ×3
ELECTRODE REM PT RTRN 9FT ADLT (ELECTROSURGICAL) ×1 IMPLANT
GAUZE SPONGE 4X4 12PLY STRL (GAUZE/BANDAGES/DRESSINGS) ×3 IMPLANT
GLOVE BIO SURGEON STRL SZ8 (GLOVE) ×6 IMPLANT
GLOVE INDICATOR 8.0 STRL GRN (GLOVE) ×3 IMPLANT
GOWN STRL REUS W/ TWL LRG LVL3 (GOWN DISPOSABLE) ×1 IMPLANT
GOWN STRL REUS W/ TWL XL LVL3 (GOWN DISPOSABLE) ×1 IMPLANT
GOWN STRL REUS W/TWL LRG LVL3 (GOWN DISPOSABLE) ×2
GOWN STRL REUS W/TWL XL LVL3 (GOWN DISPOSABLE) ×2
GUIDEPIN VERSANAIL DSP 3.2X444 ×3 IMPLANT
GUIDEWIRE BALL NOSE 80CM (WIRE) ×3 IMPLANT
HFN LH 130 DEG 11MM X 340MM (Nail) ×3 IMPLANT
HIP FRAC NAIL LAG SCR 10.5X100 (Orthopedic Implant) ×2 IMPLANT
MAT BLUE FLOOR 46X72 FLO (MISCELLANEOUS) IMPLANT
NEEDLE FILTER BLUNT 18X 1/2SAF (NEEDLE) ×2
NEEDLE FILTER BLUNT 18X1 1/2 (NEEDLE) ×1 IMPLANT
NEEDLE HYPO 22GX1.5 SAFETY (NEEDLE) ×3 IMPLANT
NS IRRIG 500ML POUR BTL (IV SOLUTION) ×3 IMPLANT
PACK HIP COMPR (MISCELLANEOUS) ×3 IMPLANT
SCREW BONE CORTICAL 5.0X38 (Screw) ×3 IMPLANT
SCREW CANN THRD AFF 10.5X100 (Orthopedic Implant) ×1 IMPLANT
STAPLER SKIN PROX 35W (STAPLE) ×3 IMPLANT
STRAP SAFETY BODY (MISCELLANEOUS) IMPLANT
SUT VIC AB 1 CT1 36 (SUTURE) ×3 IMPLANT
SUT VIC AB 2-0 CT1 (SUTURE) ×6 IMPLANT
SYR 30ML LL (SYRINGE) ×3 IMPLANT
SYRINGE 10CC LL (SYRINGE) ×3 IMPLANT
TAPE MICROFOAM 4IN (TAPE) ×3 IMPLANT

## 2016-11-18 NOTE — OR Nursing (Signed)
Dr Priscella Mannpenwarden in to evaluate pt, son at bedside to answer questions. Pt complaining of intense pain rated 10 Fentanyl 25 mg ordered,  Pt placed on monitor and O2 at 2L [place]. Fentanyl given and relieved pain to 6, pt dozing off.

## 2016-11-18 NOTE — Anesthesia Procedure Notes (Signed)
Procedure Name: Intubation Date/Time: 11/18/2016 5:13 PM Performed by: Nelda Marseille Pre-anesthesia Checklist: Patient identified, Patient being monitored, Timeout performed, Emergency Drugs available and Suction available Patient Re-evaluated:Patient Re-evaluated prior to inductionOxygen Delivery Method: Circle System Utilized Preoxygenation: Pre-oxygenation with 100% oxygen Intubation Type: IV induction Ventilation: Mask ventilation without difficulty Laryngoscope Size: Mac and 3 Grade View: Grade I Tube type: Oral Tube size: 7.0 mm Number of attempts: 1 Airway Equipment and Method: Stylet Placement Confirmation: ETT inserted through vocal cords under direct vision,  positive ETCO2 and breath sounds checked- equal and bilateral Secured at: 21 cm Tube secured with: Tape Dental Injury: Teeth and Oropharynx as per pre-operative assessment

## 2016-11-18 NOTE — Anesthesia Preprocedure Evaluation (Signed)
Anesthesia Evaluation  Patient identified by MRN, date of birth, ID band Patient awake    Reviewed: Allergy & Precautions, NPO status , Patient's Chart, lab work & pertinent test results  History of Anesthesia Complications Negative for: history of anesthetic complications  Airway Mallampati: II  TM Distance: >3 FB Neck ROM: Full    Dental  (+) Poor Dentition, Missing   Pulmonary neg pulmonary ROS, neg sleep apnea, neg COPD,    breath sounds clear to auscultation- rhonchi (-) wheezing      Cardiovascular (-) hypertension(-) CAD and (-) Past MI  Rhythm:Regular Rate:Normal - Systolic murmurs and - Diastolic murmurs    Neuro/Psych PSYCHIATRIC DISORDERS Dementia  CVA    GI/Hepatic negative GI ROS, Neg liver ROS,   Endo/Other  negative endocrine ROSneg diabetes  Renal/GU negative Renal ROS     Musculoskeletal Hip fracture s/p fall    Abdominal (+) + obese,   Peds  Hematology negative hematology ROS (+)   Anesthesia Other Findings Past Medical History: No date: Alzheimer's dementia No date: Pancreatitis No date: Stroke [facility](HCC) No date: Vascular dementia   Reproductive/Obstetrics                             Anesthesia Physical Anesthesia Plan  ASA: III  Anesthesia Plan: General   Post-op Pain Management:    Induction: Intravenous  Airway Management Planned: Oral ETT  Additional Equipment:   Intra-op Plan:   Post-operative Plan: Extubation in OR  Informed Consent: I have reviewed the patients History and Physical, chart, labs and discussed the procedure including the risks, benefits and alternatives for the proposed anesthesia with the patient or authorized representative who has indicated his/her understanding and acceptance.   Dental advisory given  Plan Discussed with: CRNA and Anesthesiologist  Anesthesia Plan Comments:         Lab Results  Component Value Date   WBC 8.5 11/18/2016   HGB 11.9 (L) 11/18/2016   HCT 34.1 (L) 11/18/2016   MCV 88.0 11/18/2016   PLT 145 (L) 11/18/2016    Anesthesia Quick Evaluation

## 2016-11-18 NOTE — Clinical Social Work Note (Signed)
Clinical Social Work Assessment  Patient Details  Name: Taylor Mack MRN: [id number] Date of Birth: August 25, 1934  Date of referral:  11/18/16               Reason for consult:  Facility Placement, Discharge Planning                Permission sought to share information with:  Chartered certified accountant granted to share information::  Yes, Verbal Permission Granted  Name::      Start::   [place]   Relationship::     Contact Information:     Housing/Transportation Living arrangements for the past 2 months:  [place] ([place]) Source of Information:  Adult Children Patient Interpreter Needed:  None Criminal Activity/Legal Involvement Pertinent to Current Situation/Hospitalization:  No - Comment as needed Significant Relationships:  Adult Children Lives with:  Facility Resident ([facility]) Do you feel safe going back to the place where you live?  Yes Need for family participation in patient care:  Yes (Comment)  Care giving concerns:  Patient is a resident at [facility].   Social Worker assessment / plan:  Holiday representative ([place]) reviewed chart and noted that patient was having hip facture surgery today. Social work Theatre manager met with patient and patient's son Taylor Mack at bedside. Social work Theatre manager introduced herself and explained the roles of social work department. Per patient's son Taylor Mack, patient has lived at [facility] in the memory care unit for 2 years now. Prior to that, patient was at [facility] for rehab. Patient's son Taylor Mack is patient's HPOA. Social work Theatre manager explained that after patient has surgery today, PT will work with patient. Social work Theatre manager explained that since patient lives at [facility], patient will need to go to SNF for short-term rehab until she get back to her baseline. Patient's son verbally agreed he understood. Patient's son  did not have a SNF preference at this time. Social work Theatre manager will continue to assist and follow as needed.   Fl2 completed and faxed out.   Employment status:  Unemployed Nurse, adult PT Recommendations:  Not assessed at this time Information / Referral to community resources:     Patient/Family's Response to care:  Patient's son is agreeable for patient to go to SNF.   Patient/Family's Understanding of and Emotional Response to Diagnosis, Current Treatment, and Prognosis:  Patient's son was pleasant and thanked social work Theatre manager for coming by.   Emotional Assessment Appearance:  Appears stated age Attitude/Demeanor/Rapport:    Affect (typically observed):  Accepting, Adaptable, Appropriate Orientation:  Oriented to Self Alcohol / Substance use:  Not Applicable Psych involvement (Current and /or in the community):  No (Comment)  Discharge Needs  Concerns to be addressed:  Basic Needs, Discharge Planning Concerns Readmission within the last 30 days:  No Current discharge risk:  Dependent with Mobility, Terminally ill Barriers to Discharge:  Continued Medical Work up   [employer], [place] Work 11/18/2016, 9:58 AM

## 2016-11-18 NOTE — Consult Note (Signed)
ORTHOPAEDIC CONSULTATION  REQUESTING PHYSICIAN: [name], MD  Chief Complaint:   Left hip pain.  History of Present Illness: Taylor Mack is a 83 y.o. female resident of an assisted living facility with several medical issues, including Alzheimer's, pancreatitis, and status post a CVA 4 years ago which has resulted in some mild left-sided weakness. The patient was in her usual state of health until she was found on the floor yesterday. She was brought to the emergency room where x-rays demonstrated a displaced intertrochanteric fracture of her left hip. The patient apparently also sustained a superficial laceration to the radial aspect of the right index metacarpal head region which was treated with skin glue, but denies any other injuries. She did not strike her head or lose consciousness. Also, the patient denies any lightheadedness, dizziness, chest pain, shortness breath, or other symptoms may have precipitated her fall. The patient's son notes that this apparently has been her third fall over the past 3-4 days.  Past Medical History:  Diagnosis Date  . Alzheimer's dementia   . Pancreatitis   . Stroke (HCC)   . Vascular dementia    Past Surgical History:  Procedure Laterality Date  . ABDOMINAL HYSTERECTOMY    . APPENDECTOMY    . CHOLECYSTECTOMY     Social History   Social History  . Marital status: Single    Spouse name: N/A  . Number of children: N/A  . Years of education: N/A   Social History Main Topics  . Smoking status: Never Smoker  . Smokeless tobacco: Never Used  . Alcohol use No  . Drug use: No  . Sexual activity: Not Asked   Other Topics Concern  . None   Social History Narrative  . None   Family History  Problem Relation Age of Onset  . Family history unknown: Yes   Allergies  Allergen Reactions  . Codeine Nausea And Vomiting  . Latex Other (See Comments)    unknown   Prior to  Admission medications   Medication Sig Start Date End Date Taking? Authorizing Provider  acetaminophen (TYLENOL) 500 MG tablet Take 500 mg by mouth every 6 (six) hours as needed.   Yes Historical Provider, MD  atorvastatin (LIPITOR) 20 MG tablet Take 20 mg by mouth daily.   Yes Historical Provider, MD  calcium-vitamin D (OSCAL WITH D) 500-200 MG-UNIT per tablet Take 0.5 tablets by mouth daily.   Yes Historical Provider, MD  clopidogrel (PLAVIX) 75 MG tablet Take 75 mg by mouth daily.   Yes Historical Provider, MD  divalproex (DEPAKOTE SPRINKLE) 125 MG capsule Take 250 mg by mouth 3 (three) times daily.   Yes Historical Provider, MD  docusate sodium (COLACE) 100 MG capsule Take 100 mg by mouth 2 (two) times daily.   Yes Historical Provider, MD  donepezil (ARICEPT) 10 MG tablet Take 10 mg by mouth at bedtime.   Yes Historical Provider, MD  loperamide (IMODIUM) 2 MG capsule Take 2 mg by mouth as needed for diarrhea or loose stools.   Yes Historical Provider, MD  Melatonin 5 MG TABS Take 1 tablet by mouth at bedtime.   Yes Historical Provider, MD  metoprolol succinate (TOPROL-XL) 25 MG 24 hr tablet Take 12.5 mg by mouth daily.   Yes Historical Provider, MD  pantoprazole (PROTONIX) 40 MG tablet Take 40 mg by mouth daily.   Yes Historical Provider, MD  psyllium (HYDROCIL/METAMUCIL) 95 % PACK Take 1 packet by mouth daily.   Yes Historical Provider, MD  sertraline (ZOLOFT) 50 MG tablet Take 50 mg by mouth at bedtime.   Yes Historical Provider, MD  Vitamin D, Ergocalciferol, (DRISDOL) 50000 UNITS CAPS capsule Take 50,000 Units by mouth every 7 (seven) days.   Yes Historical Provider, MD  amoxicillin-clavulanate (AUGMENTIN) 875-125 MG tablet Take 1 tablet by mouth 2 (two) times daily. Patient not taking: Reported on 11/01/2016 09/25/16   Gladis Riffle, MD  busPIRone (BUSPAR) 7.5 MG tablet  10/28/16   Historical Provider, MD  escitalopram (LEXAPRO) 10 MG tablet  10/18/16   Historical Provider, MD   QUEtiapine (SEROQUEL) 25 MG tablet Take 1 tablet (25 mg total) by mouth at bedtime. Patient not taking: Reported on 11/17/2016 09/13/15   [name], MD  silver sulfADIAZINE (SILVADENE) 1 % cream Apply 1 application topically daily.    Historical Provider, MD  traZODone (DESYREL) 50 MG tablet  10/18/16   Historical Provider, MD   Dg Chest 1 View  Result Date: 11/17/2016 CLINICAL DATA:  Fall with hip pain.  Femoral fracture. EXAM: CHEST 1 VIEW COMPARISON:  Chest radiograph 10127 FINDINGS: The cardiomediastinal silhouette is enlarged without pulmonary edema. No focal airspace consolidation. IMPRESSION: Cardiomegaly without pulmonary edema or focal airspace disease. Electronically Signed   By: Deatra Robinson M.D.   On: 11/17/2016 22:13   Dg Hip Unilat W Or Wo Pelvis 2-3 Views Left  Result Date: 11/17/2016 CLINICAL DATA:  Fall with hip pain EXAM: DG HIP (WITH OR WITHOUT PELVIS) 2-3V LEFT COMPARISON:  None. FINDINGS: There is a common aided intertrochanteric fracture of the left femur or with medial angulation. The femoral head remains situated within the acetabulum. No other acute abnormality of the visualized pelvis. IMPRESSION: Comminuted, medially angulated intertrochanteric fracture of the left femur. Electronically Signed   By: Deatra Robinson M.D.   On: 11/17/2016 22:10    Positive ROS: All other systems have been reviewed and were otherwise negative with the exception of those mentioned in the HPI and as above.  Physical Exam: General:  Alert, no acute distress Psychiatric:  Patient is competent for consent with normal mood and affect   Cardiovascular:  No pedal edema Respiratory:  No wheezing, non-labored breathing GI:  Abdomen is soft and non-tender Skin:  No lesions in the area of chief complaint Neurologic:  Sensation intact distally Lymphatic:  No axillary or cervical lymphadenopathy  Orthopedic Exam:  Orthopedic examination is limited to the left hip and lower extremity. Skin  inspection around the left hip is notable for some swelling, but otherwise is unremarkable. There are no rashes, erythema, ecchymosis, or abrasions. Her left lower extremity somewhat shortened and externally rotated as compared to her right lower extremity. She has tenderness to palpation of the lateral aspect of the hip. She had more moderate pain with any attempted active or passive motion of the left hip. She is able to actively dorsiflex and plantar flex her toes and ankle. Sensation is intact to light touch to all distributions. She has good capillary refill to her left foot.  X-rays:  X-rays of the pelvis and left hip are available for review. These films demonstrate a three-part displaced fracture of the intertrochanteric region of her left hip. No significant degenerative changes of the hip joint are noted. No other bony abnormalities are identified.  Assessment: Displaced three-part intertrochanteric fracture left hip.  Plan: The treatment options were discussed with the patient and her son, who is at the bedside, including both surgical and nonsurgical options. The patient and her son would  like to proceed with surgical intervention to include an intramedullary rodding of the intertrochanteric left hip fracture. This procedure has been discussed in detail with the patient and her son, as have the potential risks (including bleeding, infection, nerve and blood vessel injury, persistent or recurrent pain, malunion and/or nonunion, stiffness, need for further surgery, blood clots, strokes, heart attacks and/or arrhythmias, etc.) and benefits. The patient and her son state their understanding and wish to proceed. A formal written consent will be obtained by the nursing staff.  Thank you for asking up despite in the care of this most pleasant woman. I will be happy to follow her with you.   Maryagnes AmosJ. Jeffrey Poggi, MD  Beeper #:  [phone]  11/18/2016 8:37 AM

## 2016-11-18 NOTE — Op Note (Signed)
11/17/2016 - 11/18/2016  6:17 PM  Patient:   Taylor Mack  Pre-Op Diagnosis:   Displaced three-part intertrochanteric fracture, left hip.  Post-Op Diagnosis:   Same  Procedure:   Reduction and internal fixation of three-part intertrochanteric left hip fracture with Biomet Affixis TFN nail.  Surgeon:   Maryagnes AmosJ. Jeffrey Poggi, MD  Assistant:   None  Anesthesia:   GET  Findings:   As above  Complications:   None  EBL:   100 cc  Fluids:   500 cc crystalloid  UOP:   400 cc  TT:   None  Drains:   None  Closure:   Staples  Implants:   Biomet Affixis 11 x 340 mm TFN with a 100 mm lag screw and a 38 mm distal interlocking screw  Brief Clinical Note:   The patient is an 83 year old female who sustained the above-noted injury yesterday afternoon when she fell in her assisted living facility. She was brought to the emergency room where x-rays demonstrated the above-noted injury. The patient has been cleared medically and presents at this time for reduction and internal fixation of the displaced intertrochanteric left hip fracture.  Procedure:   The patient was brought into the operating room. After adequate general endotracheal intubation and anesthesia was obtained, the patient was lain in the supine position on the fracture table. The uninjured leg was placed in a flexed and abducted position while the injured lower extremity was placed in longitudinal traction. The fracture was reduced using longitudinal traction and internal rotation. The adequacy of reduction was verified fluoroscopically in AP and lateral projections and found to be near anatomic. The lateral aspects of the left hip and thigh were prepped with ChloraPrep solution before being draped sterilely. Preoperative antibiotics were administered. A timeout was performed to verify the appropriate surgical site. The greater trochanter was identified fluoroscopically and an approximately 3 cm incision made about 2-3 fingerbreadths above  the tip of the greater trochanter. The incision was carried down through the subcutaneous tissues to expose the gluteal fascia. This was split the length of the incision, providing access to the tip of the trochanter. Under fluoroscopic guidance, a guidewire was drilled through the tip of the trochanter into the proximal metaphysis to the level of the lesser trochanter. After verifying its position fluoroscopically in AP and lateral projections, it was overreamed with the initial reamer to the depth of the lesser trochanter. A guidewire was passed down through the femoral canal to the supracondylar region. The adequacy of guidewire position was verified fluoroscopically in AP and lateral projections before the length of the guidewire within the canal was measured and found to be 360 mm. Therefore, a 340 mm length nail was selected. The guidewire was overreamed sequentially using the flexible reamers, beginning with a 10 mm reamer and progressing to a 12.5 mm reamer. This provided good cortical chatter. The 11 x 340 mm Biomet Affixis TFN rod was selected and advanced to the appropriate depth, as verified fluoroscopically. The guide system for the lag screw was positioned and advanced through an approximately 2 cm stab incision over the lateral aspect of the proximal femur. The guidewire was drilled up through the trochanteric femoral nail and into the femoral neck to rest within 5 mm of subchondral bone. After verifying its position in the femoral neck and head in both AP and lateral projections, the guidewire was measured and found to be optimally replicated by a 100 mm lag screw. The guidewire was overreamed to the  appropriate depth before the lag screw was inserted and advanced to the appropriate depth as verified fluoroscopically in AP and lateral projections. The locking screw was advanced, then backed off a quarter turn to set the lag screw. Again the adequacy of hardware position and fracture reduction was  verified fluoroscopically in AP and lateral projections and found to be excellent.  Attention was directed distally. Using the "perfect circle" technique, the leg and fluoroscopy machine were positioned appropriately. An approximate 1.5 cm stab incision was made over the skin at the appropriate point before the drill bit was advanced through the cortex and across the static hole of the nail. The appropriate length of the screw was determined before the 38 mm distal interlocking screw was positioned, then advanced and tightened securely. Again the adequacy of screw position was verified fluoroscopically in AP and lateral projections and found to be excellent.  The wounds were irrigated thoroughly with sterile saline solution before the deeper subcutaneous tissues were closed using 2-0 Vicryl interrupted sutures. The skin was closed using staples. A total of 30 cc of 0.5% Sensorcaine with epinephrine was injected in and around all incisions. Sterile occlusive dressings were applied to all wounds before the patient was awakened, extubated, and returned to her hospital bed. The patient was then transferred to the recovery room in satisfactory condition after tolerating the procedure well.

## 2016-11-18 NOTE — Anesthesia Postprocedure Evaluation (Signed)
Anesthesia Post Note  Patient: Taylor Mack  Procedure(s) Performed: Procedure(s) (LRB): INTRAMEDULLARY (IM) NAIL INTERTROCHANTRIC (Left)  Patient location during evaluation: PACU Anesthesia Type: General Level of consciousness: awake and alert and oriented Pain management: pain level controlled Vital Signs Assessment: post-procedure vital signs reviewed and stable Respiratory status: spontaneous breathing, nonlabored ventilation and respiratory function stable Cardiovascular status: blood pressure returned to baseline and stable Postop Assessment: no signs of nausea or vomiting Anesthetic complications: no     Last Vitals:  Vitals:   11/18/16 1956 11/18/16 2027  BP: 136/63 133/64  Pulse: (!) 59 71  Resp: 16 16  Temp: 36.9 C 36.8 C    Last Pain:  Vitals:   11/18/16 2027  TempSrc: Oral  PainSc:                  Aseret Hoffman

## 2016-11-18 NOTE — Anesthesia Post-op Follow-up Note (Cosign Needed)
Anesthesia QCDR form completed.        

## 2016-11-18 NOTE — Transfer of Care (Signed)
Immediate Anesthesia Transfer of Care Note  Patient: Taylor Mack  Procedure(s) Performed: Procedure(s): INTRAMEDULLARY (IM) NAIL INTERTROCHANTRIC (Left)  Patient Location: PACU  Anesthesia Type:General  Level of Consciousness: responds to stimulation  Airway & Oxygen Therapy: Patient Spontanous Breathing and Patient connected to nasal cannula oxygen  Post-op Assessment: Report given to RN and Post -op Vital signs reviewed and stable  Post vital signs: Reviewed and stable  Last Vitals:  Vitals:   11/18/16 1608 11/18/16 1828  BP: (!) 159/71 (!) 99/40  Pulse: (!) 57 71  Resp: 14 (!) 22  Temp:  36.3 C    Last Pain:  Vitals:   11/18/16 1828  TempSrc: Temporal  PainSc: (P) Asleep         Complications: No apparent anesthesia complications

## 2016-11-18 NOTE — Progress Notes (Signed)
[facility] [place] [facility]   PATIENT NAME: Taylor Mack    MR#:  [id number]  DATE OF BIRTH:  Sep 14, 1934  SUBJECTIVE:  CHIEF COMPLAINT:   Chief Complaint  Patient presents with  . Leg Pain  . Fall  The patient is 83 year old Caucasian female with history of Alzheimer's dementia, stroke, vascular dementia, who presents to the hospital after fall and was found to have left hip fracture. Patient complains of left hip pain, although admits that it's not as bad as it was yesterday. She is about to go to operating room for surgery.  Review of Systems  Constitutional: Negative for chills, fever and weight loss.  HENT: Negative for congestion.   Eyes: Negative for blurred vision and double vision.  Respiratory: Negative for cough, sputum production, shortness of breath and wheezing.   Cardiovascular: Negative for chest pain, palpitations, orthopnea, leg swelling and PND.  Gastrointestinal: Negative for abdominal pain, blood in stool, constipation, diarrhea, nausea and vomiting.  Genitourinary: Negative for dysuria, frequency, hematuria and urgency.  Musculoskeletal: Positive for joint pain. Negative for falls.  Neurological: Negative for dizziness, tremors, focal weakness and headaches.  Endo/Heme/Allergies: Does not bruise/bleed easily.  Psychiatric/Behavioral: Negative for depression. The patient does not have insomnia.     VITAL SIGNS: Blood pressure 130/65, pulse 79, temperature 98.7 F (37.1 C), temperature source Oral, resp. rate 12, height 5\' 2"  (1.575 m), weight 75.3 kg (165 lb 14.4 oz), SpO2 94 %.  PHYSICAL EXAMINATION:   GENERAL:  83 y.o.-year-old patient lying in the bed with no acute distress.  EYES: Pupils equal, round, reactive to light and accommodation. No scleral icterus. Extraocular muscles intact.  HEENT: Head atraumatic, normocephalic. Oropharynx and nasopharynx clear.  NECK:  Supple, no jugular venous distention. No thyroid  enlargement, no tenderness.  LUNGS: Normal breath sounds bilaterally, no wheezing, rales,rhonchi or crepitation. No use of accessory muscles of respiration.  CARDIOVASCULAR: S1, S2 normal. No murmurs, rubs, or gallops.  ABDOMEN: Soft, nontender, nondistended. Bowel sounds present. No organomegaly or mass.  EXTREMITIES: No pedal edema, cyanosis, or clubbing.  NEUROLOGIC: Cranial nerves II through XII are intact. Muscle strength 5/5 in all extremities, but left lower extremity, which is immobilized. Sensation grossly intact. Gait not checked.  PSYCHIATRIC: The patient is alert , but not oriented  SKIN: No obvious rash, lesion, or ulcer.   ORDERS/RESULTS REVIEWED:   CBC  Recent Labs Lab 11/17/16 2118 11/18/16 0413  WBC 7.1 8.5  HGB 13.4 11.9*  HCT 37.3 34.1*  PLT 159 145*  MCV 87.4 88.0  MCH 31.3 30.7  MCHC 35.8 34.9  RDW 13.6 13.1   ------------------------------------------------------------------------------------------------------------------  Chemistries   Recent Labs Lab 11/17/16 2118 11/18/16 0413  NA 136 136  K 3.3* 4.1  CL 103 104  CO2 22 25  GLUCOSE 123* 147*  BUN 20 22*  CREATININE 1.12* 1.11*  CALCIUM 9.1 8.8*   ------------------------------------------------------------------------------------------------------------------ estimated creatinine clearance is 37.8 mL/min (by C-G formula based on SCr of 1.11 mg/dL (H)). ------------------------------------------------------------------------------------------------------------------ No results for input(s): TSH, T4TOTAL, T3FREE, THYROIDAB in the last 72 hours.  Invalid input(s): FREET3  Cardiac Enzymes No results for input(s): CKMB, TROPONINI, MYOGLOBIN in the last 168 hours.  Invalid input(s): CK ------------------------------------------------------------------------------------------------------------------ Invalid input(s):  POCBNP ---------------------------------------------------------------------------------------------------------------  RADIOLOGY: Dg Chest 1 View  Result Date: 11/17/2016 CLINICAL DATA:  Fall with hip pain.  Femoral fracture. EXAM: CHEST 1 VIEW COMPARISON:  Chest radiograph 10127 FINDINGS: The cardiomediastinal silhouette is enlarged without pulmonary edema. No focal airspace  consolidation. IMPRESSION: Cardiomegaly without pulmonary edema or focal airspace disease. Electronically Signed   By: Deatra RobinsonKevin  Herman M.D.   On: 11/17/2016 22:13   Dg Hip Unilat W Or Wo Pelvis 2-3 Views Left  Result Date: 11/17/2016 CLINICAL DATA:  Fall with hip pain EXAM: DG HIP (WITH OR WITHOUT PELVIS) 2-3V LEFT COMPARISON:  None. FINDINGS: There is a common aided intertrochanteric fracture of the left femur or with medial angulation. The femoral head remains situated within the acetabulum. No other acute abnormality of the visualized pelvis. IMPRESSION: Comminuted, medially angulated intertrochanteric fracture of the left femur. Electronically Signed   By: Deatra RobinsonKevin  Herman M.D.   On: 11/17/2016 22:10    EKG:  Orders placed or performed during the hospital encounter of 11/17/16  . EKG 12-Lead  . EKG 12-Lead    ASSESSMENT AND PLAN:  Principal Problem:   Closed left hip fracture (HCC) Active Problems:   Dementia   History of stroke #1 closed left  hip fracture, continue pain management, patient is to undergo operation today by Dr. Joice LoftsPoggi, continue physical therapy as well as orthopedist surgeon allows, likely skilled nursing facility placement for rehabilitation #2. Dementia, supportive therapy, aspiration precautions, fall precautions #3. Elevated blood pressure, likely due to pain, stress, follow closely and initiate medications as needed #4 acute renal insufficiency, follow with therapy  #5. Hypokalemia, resolved #6. Anemia, follow and transfuse postoperatively if needed  #7. MRSA carrier, continue patient on  mupirocin    Management plans discussed with the patient, family and they are in agreement.   DRUG ALLERGIES:  Allergies  Allergen Reactions  . Codeine Nausea And Vomiting  . Latex Other (See Comments)    unknown    CODE STATUS:     Code Status Orders        Start     Ordered   11/18/16 0029  Full code  Continuous     11/18/16 0028    Code Status History    Date Active Date Inactive Code Status Order ID Comments User Context   09/22/2016  7:54 PM 09/25/2016  6:23 PM Full Code [id number]  Tiney Rougealph Ely III, MD ED   12/02/2014  6:22 AM 12/03/2014  4:14 PM Full Code [id number]  Hillary BowJared M Gardner, DO Inpatient      TOTAL TIME TAKING CARE OF THIS PATIENT: 40  minutes.  Discussed with the patient's son  Katharina CaperVAICKUTE,Syed Zukas M.D on 11/18/2016 at 1:51 PM  Between 7am to 6pm - Pager - [phone]  After 6pm go to [email or web address] - password [email or web address]  [facility]  Office  [phone]  CC: Primary care physician; Pcp Not In System

## 2016-11-18 NOTE — Progress Notes (Addendum)
Clinical Child psychotherapistocial Worker (CSW) presented bed offers to patient and her son Taylor Mack. They chose [facility]. CS[facility] a message making her aware of accepted bed offer. PASARR has been received [phone].    Baker Hughes IncorporatedBailey Jolanda Mccann, LCSW [phone]

## 2016-11-18 NOTE — Clinical Social Work Placement (Addendum)
   CLINICAL SOCIAL WORK PLACEMENT  NOTE  Date:  11/18/2016  Patient Details  Name: Ma RingsBessie L Parrott MRN: [id number] Date of Birth: 23-Apr-1934  Clinical Social Work is seeking post-discharge placement for this patient at the Skilled  Nursing Facility level of care (*CSW will initial, date and re-position this form in  chart as items are completed):  Yes   Patient/family provided with [facility] Clinical Social Work Department's list of facilities offering this level of care within the geographic area requested by the patient (or if unable, by the patient's family).  Yes   Patient/family informed of their freedom to choose among providers that offer the needed level of care, that participate in Medicare, Medicaid or managed care program needed by the patient, have an available bed and are willing to accept the patient.  Yes   Patient/family informed of [facility] ownership interest in [facility] and [facility], as well as of the fact that they are under no obligation to receive care at these facilities.  PASRR submitted to EDS on    SNF level PASARR was requested today. 11/18/16  PASRR number received on       Existing PASRR number confirmed on 11/18/16 ALF    FL2 transmitted to all facilities in geographic area requested by pt/family on 11/18/16     FL2 transmitted to all facilities within larger geographic area on       Patient informed that his/her managed care company has contracts with or will negotiate with certain facilities, including the following:            Patient/family informed of bed offers received.  Patient chooses bed at       Physician recommends and patient chooses bed at      Patient to be transferred to   on  .  Patient to be transferred to facility by       Patient family notified on   of transfer.  Name of family member notified:        PHYSICIAN       Additional Comment:    _______________________________________________ Ralene BatheMackenzie  [place], Student-Social Work 11/18/2016, 10:08 AM

## 2016-11-18 NOTE — Progress Notes (Signed)
Pt. Has alzh. Admission incomplete.

## 2016-11-18 NOTE — OR Nursing (Signed)
Dr Joice Loftspoggi notifie of positive mrsa pcr screen,  Vancomycin ordered

## 2016-11-18 NOTE — NC FL2 (Signed)
[place] MEDICAID FL2 LEVEL OF CARE SCREENING TOOL     IDENTIFICATION  Patient Name: Taylor Mack Birthdate: September 29, 1934 Sex: female Admission Date (Current Location): 11/17/2016  [facility] and [place] Number:  Chiropodist and Address:  [facility], [address], [place], [place] [address]      Provider Number: [id number]  Attending Physician Name and Address:  [name], MD  Relative Name and Phone Number:       Current Level of Care: Hospital Recommended Level of Care: Skilled Nursing Facility Prior Approval Number:    Date Approved/Denied:   PASRR Number:    Discharge Plan: SNF    Current Diagnoses: Patient Active Problem List   Diagnosis Date Noted  . Closed left hip fracture (HCC) 11/17/2016  . Diverticulitis of large intestine with abscess without bleeding 09/22/2016  . Pancreatitis, acute 12/02/2014  . Dementia 12/02/2014  . History of stroke 12/02/2014  . Acute pancreatitis 12/02/2014    Orientation RESPIRATION BLADDER Height & Weight     Self  Normal External catheter Weight: 165 lb 14.4 oz (75.3 kg) Height:  5\' 2"  (157.5 cm)  BEHAVIORAL SYMPTOMS/MOOD NEUROLOGICAL BOWEL NUTRITION STATUS   (None. )  (None. ) Incontinent Diet (Diet: NPO due to surgery )  AMBULATORY STATUS COMMUNICATION OF NEEDS Skin   Extensive Assist Verbally Normal                       Personal Care Assistance Level of Assistance  Bathing, Feeding, Dressing Bathing Assistance: Limited assistance Feeding assistance: Independent Dressing Assistance: Limited assistance     Functional Limitations Info  Sight, Hearing, Speech Sight Info: Adequate Hearing Info: Adequate Speech Info: Impaired (Missing Teeth )    SPECIAL CARE FACTORS FREQUENCY  PT (By licensed PT), OT (By licensed OT)     PT Frequency:  (5) OT Frequency:  (5)            Contractures      Additional Factors Info  Code Status, Allergies Code Status Info:  (Full  Code) Allergies Info:  (Codeine, Latex)           Current Medications (11/18/2016):  This is the current hospital active medication list Current Facility-Administered Medications  Medication Dose Route Frequency Provider Last Rate Last Dose  . 0.9 %  sodium chloride infusion   Intravenous Continuous Oralia Manis, MD 75 mL/hr at 11/18/16 0053    . acetaminophen (TYLENOL) tablet 650 mg  650 mg Oral Q6H PRN Oralia Manis, MD       Or  . acetaminophen (TYLENOL) suppository 650 mg  650 mg Rectal Q6H PRN Oralia Manis, MD      . atorvastatin (LIPITOR) tablet 20 mg  20 mg Oral Daily Oralia Manis, MD   Stopped at 11/18/16 1000  . ceFAZolin (ANCEF) IVPB 2g/100 mL premix  2 g Intravenous 30 min Pre-Op Christena Flake, MD      . Chlorhexidine Gluconate Cloth 2 % PADS 6 each  6 each Topical W1191 Christena Flake, MD   6 each at 11/18/16 [phone]  . divalproex (DEPAKOTE SPRINKLE) capsule 250 mg  250 mg Oral TID Oralia Manis, MD   Stopped at 11/18/16 1000  . donepezil (ARICEPT) tablet 10 mg  10 mg Oral QHS Oralia Manis, MD      . metoprolol succinate (TOPROL-XL) 24 hr tablet 12.5 mg  12.5 mg Oral Daily Oralia Manis, MD   12.5 mg at 11/18/16 0906  .  morphine 2 MG/ML injection 2 mg  2 mg Intravenous Q4H PRN Oralia Manisavid Willis, MD      . mupirocin ointment (BACTROBAN) 2 % 1 application  1 application Nasal BID Christena FlakeJohn J Poggi, MD   1 application at 11/18/16 [phone]  . ondansetron (ZOFRAN) tablet 4 mg  4 mg Oral Q6H PRN Oralia Manisavid Willis, MD       Or  . ondansetron [facility](ZOFRAN) injection 4 mg  4 mg Intravenous Q6H PRN Oralia Manisavid Willis, MD      . oxyCODONE (Oxy IR/ROXICODONE) immediate release tablet 5 mg  5 mg Oral Q4H PRN Oralia Manisavid Willis, MD   5 mg at 11/18/16 0906  . pantoprazole (PROTONIX) EC tablet 40 mg  40 mg Oral Daily Oralia Manisavid Willis, MD   Stopped at 11/18/16 1000  . sertraline (ZOLOFT) tablet 50 mg  50 mg Oral QHS Oralia Manisavid Willis, MD      . sodium chloride flush (NS) 0.9 % injection 3 mL  3 mL Intravenous Q12H Oralia Manisavid Willis, MD          Discharge Medications: Please see discharge summary for a list of discharge medications.  Relevant Imaging Results:  Relevant Lab Results:   Additional Information  (SSN: [id number])  Ralene BatheMackenzie Kojo Liby, Student-Social Work

## 2016-11-18 NOTE — Progress Notes (Signed)
Patient left the floor to go to the OR for surgery. Son is accompanying the patient there. Vital signs stable.

## 2016-11-19 ENCOUNTER — Encounter: Payer: Self-pay | Admitting: Surgery

## 2016-11-19 DIAGNOSIS — Z9889 Other specified postprocedural states: Secondary | ICD-10-CM

## 2016-11-19 DIAGNOSIS — N289 Disorder of kidney and ureter, unspecified: Secondary | ICD-10-CM

## 2016-11-19 DIAGNOSIS — Z22322 Carrier or suspected carrier of Methicillin resistant Staphylococcus aureus: Secondary | ICD-10-CM

## 2016-11-19 DIAGNOSIS — E876 Hypokalemia: Secondary | ICD-10-CM

## 2016-11-19 DIAGNOSIS — R030 Elevated blood-pressure reading, without diagnosis of hypertension: Secondary | ICD-10-CM

## 2016-11-19 DIAGNOSIS — D649 Anemia, unspecified: Secondary | ICD-10-CM

## 2016-11-19 LAB — CBC WITH DIFFERENTIAL/PLATELET
BASOS PCT: 0 %
Basophils Absolute: 0 10*3/uL (ref 0–0.1)
Eosinophils Absolute: 0 10*3/uL (ref 0–0.7)
Eosinophils Relative: 0 %
HEMATOCRIT: 29.7 % — AB (ref 35.0–47.0)
HEMOGLOBIN: 10.2 g/dL — AB (ref 12.0–16.0)
LYMPHS PCT: 7 %
Lymphs Abs: 0.6 10*3/uL — ABNORMAL LOW (ref 1.0–3.6)
MCH: 30.7 pg (ref 26.0–34.0)
MCHC: 34.3 g/dL (ref 32.0–36.0)
MCV: 89.5 fL (ref 80.0–100.0)
MONO ABS: 0.8 10*3/uL (ref 0.2–0.9)
MONOS PCT: 10 %
NEUTROS ABS: 6.8 10*3/uL — AB (ref 1.4–6.5)
NEUTROS PCT: 83 %
Platelets: 140 10*3/uL — ABNORMAL LOW (ref 150–440)
RBC: 3.32 MIL/uL — ABNORMAL LOW (ref 3.80–5.20)
RDW: 13.4 % (ref 11.5–14.5)
WBC: 8.2 10*3/uL (ref 3.6–11.0)

## 2016-11-19 LAB — BASIC METABOLIC PANEL
ANION GAP: 6 (ref 5–15)
BUN: 22 mg/dL — ABNORMAL HIGH (ref 6–20)
CALCIUM: 8 mg/dL — AB (ref 8.9–10.3)
CHLORIDE: 105 mmol/L (ref 101–111)
CO2: 25 mmol/L (ref 22–32)
Creatinine, Ser: 1.14 mg/dL — ABNORMAL HIGH (ref 0.44–1.00)
GFR calc non Af Amer: 44 mL/min — ABNORMAL LOW (ref >60)
GFR, EST AFRICAN AMERICAN: 51 mL/min — AB (ref >60)
GLUCOSE: 153 mg/dL — AB (ref 65–99)
Potassium: 4.1 mmol/L (ref 3.5–5.1)
Sodium: 136 mmol/L (ref 135–145)

## 2016-11-19 LAB — VITAMIN B12: Vitamin B-12: 182 pg/mL (ref 180–914)

## 2016-11-19 MED ORDER — MUPIROCIN 2 % EX OINT: 1.0000 "application " | TOPICAL_OINTMENT | Freq: Two times a day (BID) | CUTANEOUS | 0 refills | Status: AC

## 2016-11-19 MED ORDER — ENOXAPARIN SODIUM 40 MG/0.4ML ~~LOC~~ SOLN: 40.0000 mg | SUBCUTANEOUS | 0 refills | Status: DC

## 2016-11-19 MED ORDER — ACETAMINOPHEN 325 MG PO TABS
650.0000 mg | ORAL_TABLET | Freq: Four times a day (QID) | ORAL | Status: DC | PRN
  Administered 2016-11-20 – 2016-11-21 (×3): 650 mg via ORAL
  Filled 2016-11-19 (×3): qty 2

## 2016-11-19 MED ORDER — CHLORHEXIDINE GLUCONATE CLOTH 2 % EX PADS: 6.0000 | MEDICATED_PAD | Freq: Every day | CUTANEOUS | 0 refills | Status: AC

## 2016-11-19 MED ORDER — ACETAMINOPHEN 650 MG RE SUPP: 650.0000 mg | Freq: Four times a day (QID) | RECTAL | Status: DC | PRN

## 2016-11-19 MED ORDER — OXYCODONE HCL 5 MG PO TABS: 5.0000 mg | ORAL_TABLET | ORAL | 0 refills | Status: DC | PRN

## 2016-11-19 NOTE — Evaluation (Signed)
Physical Therapy Evaluation Patient Details Name: Taylor Mack MRN: [id number] DOB: 09/24/1934 Today's Date: 11/19/2016   History of Present Illness  83 y.o. female who presents with Fall at her memory care unit and subsequent left hip fracture, she is status post L hip ORIF.  Clinical Impression  Pt showed good effort with post-op PT when she was able to focus on the requested tasks, but her confusion did make this inconsistent.  She needed a lot of assist for getting to EOB and excessive cuing and encouragement for ambulation but she did do fairly well with rising to standing w/o a lot of assist.  She struggled to understand exercises initially and had some pain tolerate issues while doing them but she was willing to try.  Pt hesitant with ambulation, and with both WBing and quality of movement was limited.     Follow Up Recommendations SNF    Equipment Recommendations       Recommendations for Other Services       Precautions / Restrictions Precautions Precautions: Fall Restrictions Weight Bearing Restrictions: Yes LLE Weight Bearing: Weight bearing as tolerated      Mobility  Bed Mobility Overal bed mobility: Needs Assistance Bed Mobility: Supine to Sit     Supine to sit: Mod assist;Max assist     General bed mobility comments: Pt attempted to help, but needed a lot of assitance to get to sitting EOB  Transfers Overall transfer level: Needs assistance Equipment used: Rolling walker (2 wheeled) Transfers: Sit to/from Stand Sit to Stand: Min assist         General transfer comment: With cuing for UE placement but was able to rise with only light assist though she c/o pain the entire time.  Ambulation/Gait Ambulation/Gait assistance: Mod assist Ambulation Distance (Feet): 5 Feet Assistive device: Rolling walker (2 wheeled)       General Gait Details: Pt very hesitant to put weight through L LE and also had a lot of issues with advancing L LE.  She did not have  any LOBs, but overall was limited and needed constant cuing and encouargement to do even the little walking she was able to do.    Stairs            Wheelchair Mobility    Modified Rankin (Stroke Patients Only)       Balance Overall balance assessment: Needs assistance   Sitting balance-Leahy Scale: Fair       Standing balance-Leahy Scale: Fair                               Pertinent Vitals/Pain Pain Assessment: 0-10 Pain Score: 4  Pain Location: pain in L hip increases significantly with any movement    Home Living Family/patient expects to be discharged to:: Skilled nursing facility                 Additional Comments: Pt moved to memory care unit ~2 years ago    Prior Function           Comments: Pt has walker, uses it occasionally but typically is able to walk in the unit as needed.      Hand Dominance        Extremity/Trunk Assessment   Upper Extremity Assessment Upper Extremity Assessment: Generalized weakness (age appropriate limitations)    Lower Extremity Assessment Lower Extremity Assessment: LLE deficits/detail (R LE grossly 4-/5 t/o, WFL) LLE Deficits / Details:  Pt hesitant with any motion (active/passive) on the L secondary to pain but with cuing and encouragement she was able to show some AROM grossly 3/5 with inability to lift LE against gravity       Communication   Communication: No difficulties  Cognition   Behavior During Therapy: Impulsive (pt with confusion, often off topic) Overall Cognitive Status: History of cognitive impairments - at baseline                      General Comments      Exercises General Exercises - Lower Extremity Ankle Circles/Pumps: AROM;10 reps Short Arc Quad: AROM;Strengthening;10 reps Heel Slides: AAROM;10 reps Hip ABduction/ADduction: AAROM;10 reps   Assessment/Plan    PT Assessment Patient needs continued PT services  PT Problem List Decreased strength;Decreased  range of motion;Decreased activity tolerance;Decreased balance;Decreased mobility;Decreased coordination;Decreased cognition;Decreased knowledge of use of DME;Decreased safety awareness;Pain          PT Treatment Interventions DME instruction;Gait training;Functional mobility training;Therapeutic activities;Therapeutic exercise;Balance training;Cognitive remediation;Patient/family education    PT Goals (Current goals can be found in the Care Plan section)  Acute Rehab PT Goals Patient Stated Goal: get rid of the pain PT Goal Formulation: With patient/family Time For Goal Achievement: 12/03/16 Potential to Achieve Goals: Fair    Frequency BID   Barriers to discharge        Co-evaluation               End of Session Equipment Utilized During Treatment: Gait belt Activity Tolerance: Patient limited by pain Patient left: with chair alarm set;with call bell/phone within reach;with family/visitor present           Time: 0930-1004 PT Time Calculation (min) (ACUTE ONLY): 34 min   Charges:   PT Evaluation $PT Eval Low Complexity: 1 Procedure PT Treatments $Therapeutic Exercise: 8-22 mins   PT G Codes:        Malachi Pro, DPT 11/19/2016, 11:59 AM

## 2016-11-19 NOTE — Progress Notes (Signed)
Plan is for patient to D/C [facility] over the weekend if stable. [facility]. Clinical Child psychotherapistocial Worker (CSW) sent [facility]C Summary to [facility] today via [employer]. Patient's son [name] is aware of above. CSW will continue to follow and assist as needed.   Baker Hughes IncorporatedBailey Leianne Callins, LCSW [phone]

## 2016-11-19 NOTE — Discharge Summary (Addendum)
[facility] - [place] at [facility]   PATIENT NAME: Mansi Tokar    MR#:  [id number]  DATE OF BIRTH:  [birth year]  DATE OF ADMISSION:  11/17/2016 ADMITTING PHYSICIAN: Oralia Manis, MD  DATE OF DISCHARGE: 11/21/2016  PRIMARY CARE PHYSICIAN: Pcp Not In System   ADMISSION DIAGNOSIS:  Closed fracture of left hip, initial encounter (HCC) [S72.002A] Fall, initial encounter [W19.XXXA]  DISCHARGE DIAGNOSIS:  Principal Problem:   Closed left hip fracture (HCC) Active Problems:   History of open reduction and internal fixation (ORIF) procedure   Elevated blood pressure reading without diagnosis of hypertension   Acute renal insufficiency   Hypokalemia   Anemia   MRSA carrier   Dementia   History of stroke   SECONDARY DIAGNOSIS:   Past Medical History:  Diagnosis Date  . Alzheimer's dementia   . Pancreatitis   . Stroke (HCC)   . Vascular dementia     HOSPITAL COURSE:  The patient is 83 year old Caucasian female with history of Alzheimer's dementia, stroke, vascular dementia, who presents to the hospital after fall and was found to have left hip fracture. The patient underwent reduction and internal fixation of 3 part intertrochanteric left hip fracture with the nail by Dr. Joice Lofts 11/18/2016. Postoperatively she did well, worked with physical therapy and was recommended to go to rehabilitation to skilled nursing facility.   #1 closed left  hip fracture, Status post ORIF 11/18/2016 by Dr. Joice Lofts, physical therapy evaluated patient and recommended skilled nursing facility placement for rehabilitation.  #2. Dementia, supportive therapy, aspiration precautions, fall precautions.  #3. Elevated blood pressure, essential hypertension, resume outpatient medications upon discharge  #4 acute renal insufficiency, follow with therapy , stable resolved  #5. Hypokalemia, resolved  #6. Acute blood loss anemia stable  #7. MRSA carrier  Stable for discharge to  SNF  DISCHARGE CONDITIONS:   Stable  CONSULTS OBTAINED:  Treatment Team:  Christena Flake, MD  DRUG ALLERGIES:   Allergies  Allergen Reactions  . Codeine Nausea And Vomiting  . Latex Other (See Comments)    unknown    DISCHARGE MEDICATIONS:   Current Discharge Medication List    START taking these medications   Details  Chlorhexidine Gluconate Cloth 2 % PADS Apply 6 each topically daily at 6 (six) AM. Qty: 30 each, Refills: 0    !! enoxaparin (LOVENOX) 40 MG/0.4ML injection Inject 0.4 mLs (40 mg total) into the skin daily. Qty: 14 Syringe, Refills: 0    !! enoxaparin (LOVENOX) 40 MG/0.4ML injection Inject 0.4 mLs (40 mg total) into the skin daily. Qty: 14 Syringe, Refills: 0    mupirocin ointment (BACTROBAN) 2 % Place 1 application into the nose 2 (two) times daily. Qty: 22 g, Refills: 0    oxyCODONE (OXY IR/ROXICODONE) 5 MG immediate release tablet Take 1-2 tablets (5-10 mg total) by mouth every 3 (three) hours as needed for breakthrough pain. Qty: 30 tablet, Refills: 0     !! - Potential duplicate medications found. Please discuss with provider.    CONTINUE these medications which have NOT CHANGED   Details  acetaminophen (TYLENOL) 500 MG tablet Take 500 mg by mouth every 6 (six) hours as needed.    atorvastatin (LIPITOR) 20 MG tablet Take 20 mg by mouth daily.    calcium-vitamin D (OSCAL WITH D) 500-200 MG-UNIT per tablet Take 0.5 tablets by mouth daily.    clopidogrel (PLAVIX) 75 MG tablet Take 75 mg by mouth daily.    divalproex (DEPAKOTE SPRINKLE) 125  MG capsule Take 250 mg by mouth 3 (three) times daily.    docusate sodium (COLACE) 100 MG capsule Take 100 mg by mouth 2 (two) times daily.    donepezil (ARICEPT) 10 MG tablet Take 10 mg by mouth at bedtime.    loperamide (IMODIUM) 2 MG capsule Take 2 mg by mouth as needed for diarrhea or loose stools.    Melatonin 5 MG TABS Take 1 tablet by mouth at bedtime.    metoprolol succinate (TOPROL-XL) 25 MG 24  hr tablet Take 12.5 mg by mouth daily.    pantoprazole (PROTONIX) 40 MG tablet Take 40 mg by mouth daily.    psyllium (HYDROCIL/METAMUCIL) 95 % PACK Take 1 packet by mouth daily.    sertraline (ZOLOFT) 50 MG tablet Take 50 mg by mouth at bedtime.    Vitamin D, Ergocalciferol, (DRISDOL) 50000 UNITS CAPS capsule Take 50,000 Units by mouth every 7 (seven) days.    busPIRone (BUSPAR) 7.5 MG tablet     escitalopram (LEXAPRO) 10 MG tablet     QUEtiapine (SEROQUEL) 25 MG tablet Take 1 tablet (25 mg total) by mouth at bedtime. Qty: 7 tablet, Refills: 0    silver sulfADIAZINE (SILVADENE) 1 % cream Apply 1 application topically daily.    traZODone (DESYREL) 50 MG tablet       STOP taking these medications     amoxicillin-clavulanate (AUGMENTIN) 875-125 MG tablet          DISCHARGE INSTRUCTIONS:    The patient is to follow-up with primary care physician and orthopedist surgeon  If you experience worsening of your admission symptoms, develop shortness of breath, life threatening emergency, suicidal or homicidal thoughts you must seek medical attention immediately by calling 911 or calling your MD immediately  if symptoms less severe.  You Must read complete instructions/literature along with all the possible adverse reactions/side effects for all the Medicines you take and that have been prescribed to you. Take any new Medicines after you have completely understood and accept all the possible adverse reactions/side effects.   Please note  You were cared for by a hospitalist during your hospital stay. If you have any questions about your discharge medications or the care you received while you were in the hospital after you are discharged, you can call the unit and asked to speak with the hospitalist on call if the hospitalist that took care of you is not available. Once you are discharged, your primary care physician will handle any further medical issues. Please note that NO REFILLS for  any discharge medications will be authorized once you are discharged, as it is imperative that you return to your primary care physician (or establish a relationship with a primary care physician if you do not have one) for your aftercare needs so that they can reassess your need for medications and monitor your lab values.    Today   CHIEF COMPLAINT:   Chief Complaint  Patient presents with  . Leg Pain  . Fall    HISTORY OF PRESENT ILLNESS:   Karl BalesBessie Mackowiak  is a 83 y.o. female who presents with Fall and subsequent left hip fracture. Patient is in a memory care unit due to dementia and cannot contribute to her history of present illness. History is taken from her son who is at bedside with her. Son states he was called by the nursing facility and told that she had a fall, but seemed to be okay and was back in bed the planned physician  visit for tomorrow. He was called in later and told that she was found to Out of bed on the floor, this time complaining of her left hip hurting. Here in the ED x-ray imaging confirmed left hip fracture.    VITAL SIGNS:  Blood pressure (!) 113/55, pulse 62, temperature 97.9 F (36.6 C), temperature source Oral, resp. rate 19, height 5\' 2"  (1.575 m), weight 75.3 kg (165 lb 14.4 oz), SpO2 98 %.  I/O:    Intake/Output Summary (Last 24 hours) at 11/19/16 1522 Last data filed at 11/19/16 0802  Gross per 24 hour  Intake          1366.25 ml  Output              840 ml  Net           526.25 ml    PHYSICAL EXAMINATION:  GENERAL:  83 y.o.-year-old patient lying in the bed with no acute distress.  LUNGS: Normal breath sounds bilaterally CARDIOVASCULAR: S1, S2 EXTREMITIES: No pedal edema NEUROLOGIC: Motor strength 5/5. Limited assessment in left lower extremity due to pain PSYCHIATRIC: The patient is pleasantly confused  DATA REVIEW:   CBC  Recent Labs Lab 11/19/16 0421  WBC 8.2  HGB 10.2*  HCT 29.7*  PLT 140*    Chemistries   Recent  Labs Lab 11/19/16 0421  NA 136  K 4.1  CL 105  CO2 25  GLUCOSE 153*  BUN 22*  CREATININE 1.14*  CALCIUM 8.0*    Cardiac Enzymes No results for input(s): TROPONINI in the last 168 hours.  Microbiology Results  Results for orders placed or performed during the hospital encounter of 11/17/16  Surgical pcr screen     Status: Abnormal   Collection Time: 11/18/16 12:30 AM  Result Value Ref Range Status   MRSA, PCR POSITIVE (A) NEGATIVE Final    Comment: RESULT CALLED TO, READ BACK BY AND VERIFIED WITH: TRACY TOOMBS @ 0205 ON 11/18/2016 BY CAF     Staphylococcus aureus POSITIVE (A) NEGATIVE Final    Comment:        The Xpert SA Assay (FDA approved for NASAL specimens in patients over 31 years of age), is one component of a comprehensive surveillance program.  Test performance has been validated by [facility] for patients greater than or equal to 66 year old. It is not intended to diagnose infection nor to guide or monitor treatment.     RADIOLOGY:  Dg Chest 1 View  Result Date: 11/17/2016 CLINICAL DATA:  Fall with hip pain.  Femoral fracture. EXAM: CHEST 1 VIEW COMPARISON:  Chest radiograph 10127 FINDINGS: The cardiomediastinal silhouette is enlarged without pulmonary edema. No focal airspace consolidation. IMPRESSION: Cardiomegaly without pulmonary edema or focal airspace disease. Electronically Signed   By: Deatra Robinson M.D.   On: 11/17/2016 22:13   Dg C-arm 61-120 Min  Result Date: 11/18/2016 CLINICAL DATA:  Femur fracture EXAM: LEFT FEMUR 2 VIEWS; DG C-ARM 61-120 MIN COMPARISON:  None. FINDINGS: Intramedullary nail fixation of LEFT intertrochanteric femur fracture. Dynamic hip screw and distal locking screw noted. No complicating features. IMPRESSION: Intramedullary nail fixation of LEFT intertrochanteric fracture. Electronically Signed   By: Genevive Bi M.D.   On: 11/18/2016 18:54   Dg Hip Unilat W Or Wo Pelvis 2-3 Views Left  Result Date: 11/17/2016 CLINICAL DATA:   Fall with hip pain EXAM: DG HIP (WITH OR WITHOUT PELVIS) 2-3V LEFT COMPARISON:  None. FINDINGS: There is a common aided intertrochanteric fracture of the  left femur or with medial angulation. The femoral head remains situated within the acetabulum. No other acute abnormality of the visualized pelvis. IMPRESSION: Comminuted, medially angulated intertrochanteric fracture of the left femur. Electronically Signed   By: Deatra Robinson M.D.   On: 11/17/2016 22:10   Dg Femur Min 2 Views Left  Result Date: 11/18/2016 CLINICAL DATA:  Femur fracture EXAM: LEFT FEMUR 2 VIEWS; DG C-ARM 61-120 MIN COMPARISON:  None. FINDINGS: Intramedullary nail fixation of LEFT intertrochanteric femur fracture. Dynamic hip screw and distal locking screw noted. No complicating features. IMPRESSION: Intramedullary nail fixation of LEFT intertrochanteric fracture. Electronically Signed   By: Genevive Bi M.D.   On: 11/18/2016 18:54    EKG:   Orders placed or performed during the hospital encounter of 11/17/16  . EKG 12-Lead  . EKG 12-Lead      Management plans discussed with the patient, family and they are in agreement.  CODE STATUS:     Code Status Orders        Start     Ordered   11/18/16 0029  Full code  Continuous     11/18/16 0028    Code Status History    Date Active Date Inactive Code Status Order ID Comments User Context   09/22/2016  7:54 PM 09/25/2016  6:23 PM Full Code [id number]  Tiney Rouge III, MD ED   12/02/2014  6:22 AM 12/03/2014  4:14 PM Full Code [id number]  Hillary Bow, DO Inpatient     TOTAL TIME TAKING CARE OF THIS PATIENT: 40 minutes.   Milagros Loll R M.D on 11/21/2016 at 9:48 AM  Between 7am to 6pm - Pager - [phone]  After 6pm go to [email or web address] - password [email or web address]  [facility]  Office  [phone]  CC:  Primary care physician; Pcp Not In System

## 2016-11-19 NOTE — Progress Notes (Signed)
Subjective: 1 Day Post-Op Procedure(s) (LRB): INTRAMEDULLARY (IM) NAIL INTERTROCHANTRIC (Left) Patient reports pain as moderate.   Patient is well, and has had no acute complaints or problems Plan is to go Skilled nursing facility after hospital stay. Negative for chest pain and shortness of breath Fever: no Gastrointestinal:Negative for nausea and vomiting  Objective: Vital signs in last 24 hours: Temp:  [97.4 F (36.3 C)-98.7 F (37.1 C)] 98.1 F (36.7 C) (02/09 0821) Pulse Rate:  [57-71] 59 (02/09 0821) Resp:  [13-22] 17 (02/09 0821) BP: (99-176)/(40-88) 111/53 (02/09 0821) SpO2:  [92 %-100 %] 94 % (02/09 0821)  Intake/Output from previous day:  Intake/Output Summary (Last 24 hours) at 11/19/16 1123 Last data filed at 11/19/16 0802  Gross per 24 hour  Intake          1366.25 ml  Output              840 ml  Net           526.25 ml    Intake/Output this shift: Total I/O In: -  Out: 100 [Urine:100]  Labs:  Recent Labs  11/17/16 2118 11/18/16 0413 11/19/16 0421  HGB 13.4 11.9* 10.2*    Recent Labs  11/18/16 0413 11/19/16 0421  WBC 8.5 8.2  RBC 3.88 3.32*  HCT 34.1* 29.7*  PLT 145* 140*    Recent Labs  11/18/16 0413 11/19/16 0421  NA 136 136  K 4.1 4.1  CL 104 105  CO2 25 25  BUN 22* 22*  CREATININE 1.11* 1.14*  GLUCOSE 147* 153*  CALCIUM 8.8* 8.0*   No results for input(s): LABPT, INR in the last 72 hours.   EXAM General - Patient is Alert, Appropriate and able to answer questions about her leg. Extremity - ABD soft Sensation intact distally Intact pulses distally Dorsiflexion/Plantar flexion intact Incision: dressing C/D/I No cellulitis present Dressing/Incision - clean, dry, no drainage Motor Function - intact, moving foot and toes well on exam.  Negative Homan's to the left leg  Past Medical History:  Diagnosis Date  . Alzheimer's dementia   . Pancreatitis   . Stroke (HCC)   . Vascular dementia     Assessment/Plan: 1 Day  Post-Op Procedure(s) (LRB): INTRAMEDULLARY (IM) NAIL INTERTROCHANTRIC (Left) Principal Problem:   Closed left hip fracture (HCC) Active Problems:   Dementia   History of stroke  Estimated body mass index is 30.34 kg/m as calculated from the following:   Height as of this encounter: 5\' 2"  (1.575 m).   Weight as of this encounter: 75.3 kg (165 lb 14.4 oz). Advance diet Up with therapy D/C IV fluids when tolerating po intake.  Labs reviewed. Foley removed today.  Up with PT, recommending SNF. Encourage incentive spirometer usage. Pt has not have a BM, move on to FLEET enema if needed.  DVT Prophylaxis - Lovenox, Foot Pumps and TED hose Weight-Bearing as tolerated to left leg  J. Horris LatinoLance McGhee, PA-C [facility] 11/19/2016, 11:23 AM

## 2016-11-19 NOTE — Care Management Important Message (Signed)
Important Message  Patient Details  Name: Ma RingsBessie L Gossard MRN: [id number] Date of Birth: 1933-12-19   Medicare Important Message Given:  Yes Copy of initial signed IM given  Marily MemosLisa M Chandani Rogowski, RN 11/19/2016, 9:32 AM

## 2016-11-19 NOTE — Progress Notes (Signed)
Physical Therapy Treatment Patient Details Name: Taylor RingsBessie L Lineman MRN: [id number] DOB: 01-01-1934 Today's Date: 11/19/2016    History of Present Illness 83 y.o. female who presents with Fall at her memory care unit and subsequent left hip fracture, she is status post ORIF (2/8).    PT Comments    Pt confused t/o the session and generally was resistant to most cuing and despite heavy encouragement she was generally limited with what she could/would do.  Pt needing constant cuing to stay on task but she was unable to meaningfully take any steps (just assisted shuffling feet) to get to the bed and she was very inconsistent with her ability to perform exercises.    Follow Up Recommendations  SNF     Equipment Recommendations       Recommendations for Other Services       Precautions / Restrictions Precautions Precautions: Fall Restrictions Weight Bearing Restrictions: Yes LLE Weight Bearing: Weight bearing as tolerated    Mobility  Bed Mobility Overal bed mobility: Needs Assistance Bed Mobility: Sit to Supine     Supine to sit: Mod assist;Max assist Sit to supine: Max assist   General bed mobility comments: Pt agitated and unable/unwilling to assist - poor ability to follow instructions  Transfers Overall transfer level: Needs assistance Equipment used: Rolling walker (2 wheeled) Transfers: Sit to/from Stand Sit to Stand: Mod assist         General transfer comment: Pt resistant to trying to stand up, did show some effort once PT initiated getting to standing - pt unsafe  Ambulation/Gait Ambulation/Gait assistance: Max assist Ambulation Distance (Feet): 2 Feet Assistive device: Rolling walker (2 wheeled)       General Gait Details: Pt essentially did no ambulation this afternoon, she was able to shift/slide step only very minimally and despite heavy cuing and encouragement   Stairs            Wheelchair Mobility    Modified Rankin (Stroke Patients  Only)       Balance Overall balance assessment: Needs assistance   Sitting balance-Leahy Scale: Poor       Standing balance-Leahy Scale: Poor                      Cognition Arousal/Alertness:  (pt awake but resistant to working with PT) Behavior During Therapy: Impulsive;Agitated Overall Cognitive Status: History of cognitive impairments - at baseline                      Exercises General Exercises - Lower Extremity Ankle Circles/Pumps: AAROM;10 reps Short Arc Quad: AAROM;10 reps;PROM Heel Slides: AAROM;10 reps Hip ABduction/ADduction: AAROM;10 reps    General Comments        Pertinent Vitals/Pain Pain Assessment:  (unable to rate, more pain reactive than this AM) Pain Score: 4  Pain Location: pain in L hip increases significantly with any movement    Home Living Family/patient expects to be discharged to:: Skilled nursing facility               Additional Comments: Pt moved to memory care unit ~2 years ago    Prior Function        Comments: Pt has walker, uses it occasionally but typically is able to walk in the unit as needed.    PT Goals (current goals can now be found in the care plan section) Acute Rehab PT Goals Patient Stated Goal: get rid of the pain PT Goal Formulation:  With patient/family Time For Goal Achievement: 12/03/16 Potential to Achieve Goals: Fair Progress towards PT goals: Progressing toward goals    Frequency    BID      PT Plan Current plan remains appropriate    Co-evaluation             End of Session Equipment Utilized During Treatment: Gait belt Activity Tolerance: Patient limited by pain;Treatment limited secondary to agitation Patient left: with bed alarm set;with call bell/phone within reach;with SCD's reapplied     Time: 1610-9604 PT Time Calculation (min) (ACUTE ONLY): 27 min  Charges:  $Gait Training: 8-22 mins $Therapeutic Exercise: 8-22 mins                    G Codes:       Malachi Pro, DPT 11/19/2016, 1:55 PM

## 2016-11-19 NOTE — Care Management (Signed)
Cm consult. Patient will be discharging to [facility]. Please reconsult if case manager needs arise.

## 2016-11-20 LAB — BASIC METABOLIC PANEL
Anion gap: 4 — ABNORMAL LOW (ref 5–15)
BUN: 22 mg/dL — AB (ref 6–20)
CHLORIDE: 107 mmol/L (ref 101–111)
CO2: 26 mmol/L (ref 22–32)
Calcium: 8.2 mg/dL — ABNORMAL LOW (ref 8.9–10.3)
Creatinine, Ser: 1.02 mg/dL — ABNORMAL HIGH (ref 0.44–1.00)
GFR calc Af Amer: 58 mL/min — ABNORMAL LOW (ref >60)
GFR calc non Af Amer: 50 mL/min — ABNORMAL LOW (ref >60)
GLUCOSE: 116 mg/dL — AB (ref 65–99)
POTASSIUM: 3.3 mmol/L — AB (ref 3.5–5.1)
Sodium: 137 mmol/L (ref 135–145)

## 2016-11-20 LAB — HEMOGLOBIN: Hemoglobin: 9.2 g/dL — ABNORMAL LOW (ref 12.0–16.0)

## 2016-11-20 MED ORDER — POTASSIUM CHLORIDE CRYS ER 20 MEQ PO TBCR
40.0000 meq | EXTENDED_RELEASE_TABLET | ORAL | Status: AC
  Administered 2016-11-20: 40 meq via ORAL
  Filled 2016-11-20 (×2): qty 2

## 2016-11-20 NOTE — Progress Notes (Signed)
  Subjective: 2 Days Post-Op Procedure(s) (LRB): INTRAMEDULLARY (IM) NAIL INTERTROCHANTRIC (Left) Patient reports pain as 1 on 0-10 scale.   Patient is well, and has had no acute complaints or problems Denies any CP, SOB, ABD pain. We will continue therapy today.  Plan is to go Skilled nursing facility after hospital stay.  Objective: Vital signs in last 24 hours: Temp:  [97.9 F (36.6 C)-98.7 F (37.1 C)] 98.7 F (37.1 C) (02/10 0711) Pulse Rate:  [47-70] 70 (02/10 0711) Resp:  [16-19] 16 (02/10 0711) BP: (113-140)/(50-60) 128/50 (02/10 0711) SpO2:  [91 %-98 %] 91 % (02/10 0711)  Intake/Output from previous day: 02/09 0701 - 02/10 0700 In: -  Out: 100 [Urine:100] Intake/Output this shift: No intake/output data recorded.   Recent Labs  11/17/16 2118 11/18/16 0413 11/19/16 0421 11/20/16 0310  HGB 13.4 11.9* 10.2* 9.2*    Recent Labs  11/18/16 0413 11/19/16 0421  WBC 8.5 8.2  RBC 3.88 3.32*  HCT 34.1* 29.7*  PLT 145* 140*    Recent Labs  11/19/16 0421 11/20/16 0310  NA 136 137  K 4.1 3.3*  CL 105 107  CO2 25 26  BUN 22* 22*  CREATININE 1.14* 1.02*  GLUCOSE 153* 116*  CALCIUM 8.0* 8.2*   No results for input(s): LABPT, INR in the last 72 hours.  EXAM General - Patient is Alert, Appropriate and Oriented Extremity - Neurovascular intact Sensation intact distally Intact pulses distally Dorsiflexion/Plantar flexion intact No cellulitis present Compartment soft Dressing - dressing C/D/I and minimal scant serous drainage proximal incision site Motor Function - intact, moving foot and toes well on exam.   Past Medical History:  Diagnosis Date  . Alzheimer's dementia   . Pancreatitis   . Stroke (HCC)   . Vascular dementia     Assessment/Plan:   2 Days Post-Op Procedure(s) (LRB): INTRAMEDULLARY (IM) NAIL INTERTROCHANTRIC (Left) Principal Problem:   Closed left hip fracture (HCC) Active Problems:   Dementia   History of stroke   History of  open reduction and internal fixation (ORIF) procedure   Elevated blood pressure reading without diagnosis of hypertension   Acute renal insufficiency   Hypokalemia   Anemia   MRSA carrier  Estimated body mass index is 30.34 kg/m as calculated from the following:   Height as of this encounter: 5\' 2"  (1.575 m).   Weight as of this encounter: 75.3 kg (165 lb 14.4 oz). Advance diet Up with therapy  Continue to work on BM Acute post op blood loss anemia - Hgb 9.2, recheck labs in the am CM to assist with discharge.  DVT Prophylaxis - Lovenox, Foot Pumps and TED hose Weight-Bearing as tolerated to left leg   T. Cranston Neighborhris Gaines, PA-C [facility] 11/20/2016, 8:37 AM

## 2016-11-20 NOTE — Progress Notes (Signed)
Pt mood improved, son at bedside. Continues to refuse PT, refused getting up to chair or BSC despite encouragement from son and nursing staff. Patient rates pain as "not bad", however states she is too painful to move.

## 2016-11-20 NOTE — Progress Notes (Signed)
PT Cancellation Note  Patient Details Name: Taylor Mack MRN: [id number] DOB: [birth year]   Cancelled Treatment:    Reason Eval/Treat Not Completed: Patient declined, no reason specified Pt again agitated, hostile and unequivocally refusing to work with PT.  She c/o pain with even the smallest movements repeating "Don't you know this hurts?!?"  She is confused and ultimately no amount of discussion or logic would have been able to convince her to participate.   Malachi ProGalen R Kara Melching, DPT 11/20/2016, 4:11 PM

## 2016-11-20 NOTE — Progress Notes (Signed)
Physical Therapy Treatment Patient Details Name: Taylor Mack MRN: [id number] DOB: April 10, 1934 Today's Date: 11/20/2016    History of Present Illness 83 y.o. female who presents with Fall at her memory care unit and subsequent left hip fracture, she is status post ORIF (2/8).    PT Comments    Pt agitated and unpleasant t/o session and needed excessive cuing and reinforcement just to get some PROM exercises in without pt trying to hit PT.  She would occasionally allow some light PROM but showed essentially no attempts at doing any active motion and was highly agitated and contrary to working with PT.  Pt confused and very difficult to reason with.   Follow Up Recommendations  SNF     Equipment Recommendations       Recommendations for Other Services       Precautions / Restrictions Precautions Precautions: Fall Restrictions LLE Weight Bearing: Weight bearing as tolerated    Mobility  Bed Mobility Overal bed mobility: Needs Assistance Bed Mobility: Sit to Supine     Supine to sit: Max assist;Total assist Sit to supine: Max assist   General bed mobility comments: pt agitated and unwilling to stay seated at EOB.  Pt throwing herself back onto the bed and very agitated with any attempts at doing some mobility  Transfers                 General transfer comment: deferred, pt refusing to stay sitting at EOB, adamant about not getting up or working with PT any more  Ambulation/Gait                 Stairs            Wheelchair Mobility    Modified Rankin (Stroke Patients Only)       Balance     Sitting balance-Leahy Scale: Zero (pt agitated and resistant to sitting up)                              Cognition Arousal/Alertness:  (awake) Behavior During Therapy: Impulsive;Agitated Overall Cognitive Status: History of cognitive impairments - at baseline                      Exercises General Exercises - Lower  Extremity Ankle Circles/Pumps: PROM;10 reps Short Arc Quad: PROM;10 reps Heel Slides: PROM;5 reps Hip ABduction/ADduction: PROM;10 reps Straight Leg Raises: PROM;5 reps    General Comments        Pertinent Vitals/Pain Pain Location: unable to rate, indicated severe L hip pain with any movement (even negligible movement)    Home Living                      Prior Function            PT Goals (current goals can now be found in the care plan section) Progress towards PT goals: Not progressing toward goals - comment (pt refusing to do most acts)    Frequency    BID      PT Plan Current plan remains appropriate    Co-evaluation             End of Session   Activity Tolerance: Treatment limited secondary to agitation Patient left: with bed alarm set;with call bell/phone within reach;with SCD's reapplied     Time: 0865-7846 PT Time Calculation (min) (ACUTE ONLY): 32 min  Charges:  $Gait Training: 8-22  mins $Therapeutic Activity: 8-22 mins                    G Codes:      Taylor Mack, DPT 11/20/2016, 11:41 AM

## 2016-11-20 NOTE — Progress Notes (Addendum)
Pt agitated this AM, swearing at staff. Pain meds given for discomfort. Pt refusing to participate with PT, initially refused meds. Was able to take meds without difficulty after calming down. Pt refused to get up to SOB or BSC. Used bedpan for pt's comfort. Will continue to monitor and encourage activity as tolerated.

## 2016-11-20 NOTE — Progress Notes (Signed)
[facility] - [place] at [facility]   PATIENT NAME: Taylor Mack    MR#:  [id number]  DATE OF BIRTH:  1933-12-05  SUBJECTIVE:  CHIEF COMPLAINT:   Chief Complaint  Patient presents with  . Leg Pain  . Fall   Pleasantly confused No pain  Review of Systems  Unable to perform ROS: Dementia    VITAL SIGNS: Blood pressure 132/64, pulse 78, temperature 98.7 F (37.1 C), temperature source Oral, resp. rate 16, height 5\' 2"  (1.575 m), weight 75.3 kg (165 lb 14.4 oz), SpO2 91 %.  PHYSICAL EXAMINATION:   GENERAL:  83 y.o.-year-old patient lying in the bed with no acute distress.  EYES: Pupils equal, round, reactive to light and accommodation. No scleral icterus. Extraocular muscles intact.  HEENT: Head atraumatic, normocephalic. Oropharynx and nasopharynx clear.  NECK:  Supple, no jugular venous distention. No thyroid enlargement, no tenderness.  LUNGS: Normal breath sounds bilaterally, no wheezing, rales,rhonchi or crepitation. No use of accessory muscles of respiration.  CARDIOVASCULAR: S1, S2 normal. No murmurs, rubs, or gallops.  ABDOMEN: Soft, nontender, nondistended. Bowel sounds present. No organomegaly or mass.  EXTREMITIES: No pedal edema, cyanosis, or clubbing.  NEUROLOGIC: Cranial nerves II through XII are intact. Muscle strength 5/5 in all extremities Sensation grossly intact. Gait not checked.  PSYCHIATRIC: The patient is alert , but not oriented  SKIN: No obvious rash, lesion, or ulcer.  Left Hip dressign  ORDERS/RESULTS REVIEWED:   CBC  Recent Labs Lab 11/17/16 2118 11/18/16 0413 11/19/16 0421 11/20/16 0310  WBC 7.1 8.5 8.2  --   HGB 13.4 11.9* 10.2* 9.2*  HCT 37.3 34.1* 29.7*  --   PLT 159 145* 140*  --   MCV 87.4 88.0 89.5  --   MCH 31.3 30.7 30.7  --   MCHC 35.8 34.9 34.3  --   RDW 13.6 13.1 13.4  --   LYMPHSABS  --   --  0.6*  --   MONOABS  --   --  0.8  --   EOSABS  --   --  0.0  --   BASOSABS  --   --  0.0  --    ---------------------------------------------------------------------------------------------------------------- Chemistries   Recent Labs Lab 11/17/16 2118 11/18/16 0413 11/19/16 0421 11/20/16 0310  NA 136 136 136 137  K 3.3* 4.1 4.1 3.3*  CL 103 104 105 107  CO2 22 25 25 26   GLUCOSE 123* 147* 153* 116*  BUN 20 22* 22* 22*  CREATININE 1.12* 1.11* 1.14* 1.02*  CALCIUM 9.1 8.8* 8.0* 8.2*   ------------------------------------------------------------------------------------------------------------------ estimated creatinine clearance is 41.1 mL/min (by C-G formula based on SCr of 1.02 mg/dL (H)). ------------------------------------------------------------------------------------------------------------------ No results for input(s): TSH, T4TOTAL, T3FREE, THYROIDAB in the last 72 hours.  Invalid input(s): FREET3  Cardiac Enzymes No results for input(s): CKMB, TROPONINI, MYOGLOBIN in the last 168 hours.  Invalid input(s): CK ------------------------------------------------------------------------------------------------------------------ Invalid input(s): POCBNP ---------------------------------------------------------------------------------------------------------------  RADIOLOGY: Dg C-arm 61-120 Min  Result Date: 11/18/2016 CLINICAL DATA:  Femur fracture EXAM: LEFT FEMUR 2 VIEWS; DG C-ARM 61-120 MIN COMPARISON:  None. FINDINGS: Intramedullary nail fixation of LEFT intertrochanteric femur fracture. Dynamic hip screw and distal locking screw noted. No complicating features. IMPRESSION: Intramedullary nail fixation of LEFT intertrochanteric fracture. Electronically Signed   By: Genevive Bi M.D.   On: 11/18/2016 18:54   Dg Femur Min 2 Views Left  Result Date: 11/18/2016 CLINICAL DATA:  Femur fracture EXAM: LEFT FEMUR 2 VIEWS; DG C-ARM 61-120 MIN COMPARISON:  None. FINDINGS: Intramedullary  nail fixation of LEFT intertrochanteric femur fracture. Dynamic hip screw and distal  locking screw noted. No complicating features. IMPRESSION: Intramedullary nail fixation of LEFT intertrochanteric fracture. Electronically Signed   By: Genevive BiStewart  Edmunds M.D.   On: 11/18/2016 18:54    EKG:  Orders placed or performed during the hospital encounter of 11/17/16  . EKG 12-Lead  . EKG 12-Lead    ASSESSMENT AND PLAN:  Principal Problem:   Closed left hip fracture (HCC) Active Problems:   Dementia   History of stroke   History of open reduction and internal fixation (ORIF) procedure   Elevated blood pressure reading without diagnosis of hypertension   Acute renal insufficiency   Hypokalemia   Anemia   MRSA carrier  # Closed left  hip fracture POD#2 Pain control  # acute blood loss anemia S/p surgery Repeat Hb in AM Transfuse if <7  # Dementia with inpatient delirium  # Elevated blood pressure, likely due to pain, stress Today normal  # Hypokalemia, resolved  # Anemia, follow and transfuse postoperatively if needed   # MRSA carrier, continue patient on mupirocin   DRUG ALLERGIES:  Allergies  Allergen Reactions  . Codeine Nausea And Vomiting  . Latex Other (See Comments)    unknown    CODE STATUS:     Code Status Orders        Start     Ordered   11/18/16 0029  Full code  Continuous     11/18/16 0028    Code Status History    Date Active Date Inactive Code Status Order ID Comments User Context   09/22/2016  7:54 PM 09/25/2016  6:23 PM Full Code [id number]  Tiney Rougealph Ely III, MD ED   12/02/2014  6:22 AM 12/03/2014  4:14 PM Full Code [id number]  Hillary BowJared M Gardner, DO Inpatient      TOTAL TIME TAKING CARE OF THIS PATIENT: 30  minutes.   Milagros LollSudini, Marx Doig R M.D on 11/20/2016 at 11:49 AM  Between 7am to 6pm - Pager - [phone]  After 6pm go to [email or web address] - password [email or web address]  Fabio [facility]  Office  [phone]  CC: Primary care physician; Pcp Not In System

## 2016-11-21 LAB — BASIC METABOLIC PANEL
ANION GAP: 5 (ref 5–15)
BUN: 21 mg/dL — AB (ref 6–20)
CALCIUM: 8.4 mg/dL — AB (ref 8.9–10.3)
CO2: 27 mmol/L (ref 22–32)
CREATININE: 0.85 mg/dL (ref 0.44–1.00)
Chloride: 105 mmol/L (ref 101–111)
GFR calc Af Amer: >60 mL/min (ref >60)
GLUCOSE: 100 mg/dL — AB (ref 65–99)
Potassium: 3.9 mmol/L (ref 3.5–5.1)
Sodium: 137 mmol/L (ref 135–145)

## 2016-11-21 LAB — HEMOGLOBIN: Hemoglobin: 8.9 g/dL — ABNORMAL LOW (ref 12.0–16.0)

## 2016-11-21 MED ORDER — ENOXAPARIN SODIUM 40 MG/0.4ML ~~LOC~~ SOLN: 40.0000 mg | SUBCUTANEOUS | 0 refills | Status: DC

## 2016-11-21 NOTE — Progress Notes (Signed)
Called report to [facility], spoke with Tresa EndoKelly, Charity fundraiserN, answered all questions. VSS at this time. Ems called @11 :21am.

## 2016-11-21 NOTE — Discharge Instructions (Signed)
Diet: As you were doing prior to hospitalization   Shower:  May shower but keep the wounds dry, use an occlusive plastic wrap, NO SOAKING IN TUB.  If the bandage gets wet, change with a clean dry gauze.  Dressing:  You may change your dressing as needed. Change the dressing with sterile gauze dressing.   SNF to remove staples and apply steri strips on 12/02/16  Activity:  Increase activity slowly as tolerated, but follow the weight bearing instructions below.  No lifting or driving for 6 weeks.  Weight Bearing:   Weight bearing as tolerated to left lower extremity  To prevent constipation: you may use a stool softener such as -  Colace (over the counter) 100 mg by mouth twice a day  Drink plenty of fluids (prune juice may be helpful) and high fiber foods Miralax (over the counter) for constipation as needed.    Itching:  If you experience itching with your medications, try taking only a single pain pill, or even half a pain pill at a time.  You may take up to 10 pain pills per day, and you can also use benadryl over the counter for itching or also to help with sleep.   Precautions:  If you experience chest pain or shortness of breath - call 911 immediately for transfer to the hospital emergency department!!  If you develop a fever greater that 101 F, purulent drainage from wound, increased redness or drainage from wound, or calf pain-Call [facility]                                              Follow- Up Appointment:  Please call for an appointment [facility]

## 2016-11-21 NOTE — Progress Notes (Signed)
Physical Therapy Treatment Patient Details Name: Taylor RingsBessie L Grisso MRN: [id number] DOB: 03/30/1934 Today's Date: 11/21/2016    History of Present Illness 83 y.o. female who presents with Fall at her memory care unit and subsequent left hip fracture, she is status post ORIF (2/8).    PT Comments    It continued to be very difficult to encourage pt to work with PT as she is confused and quickly becomes agitated with even minimal L LE activity.  Despite very cautious, gentle and encouraging care with exercises and transition to sitting EOB pt simply became agitated and shut down completely unwilling to continue with any participation. She was able to do some R and L LE exercises but functionally her agitation was very limiting.   Follow Up Recommendations  SNF     Equipment Recommendations       Recommendations for Other Services       Precautions / Restrictions Precautions Precautions: Fall Restrictions Weight Bearing Restrictions: Yes LLE Weight Bearing: Weight bearing as tolerated    Mobility  Bed Mobility Overal bed mobility: Needs Assistance Bed Mobility: Sit to Supine     Supine to sit: Max assist;Total assist Sit to supine: Max assist   General bed mobility comments: PT was able to get pt to stay sitting at EOB for ~ 90 seconds today, but she was highly agitated, leaning away from L hip and ultimately decided to hrow herself backward onto the bed and refused to do any more  Transfers                 General transfer comment: unequivocally refusing to even get into a positiong to attempt to get to standing  Ambulation/Gait                 Stairs            Wheelchair Mobility    Modified Rankin (Stroke Patients Only)       Balance Overall balance assessment: Needs assistance Sitting-balance support: Bilateral upper extremity supported Sitting balance-Leahy Scale: Poor Sitting balance - Comments: Pt leaning heavily on R forearm to keep  weight off L hip                             Cognition Arousal/Alertness:  (awake, not alert) Behavior During Therapy: Impulsive;Agitated Overall Cognitive Status: History of cognitive impairments - at baseline                      Exercises General Exercises - Lower Extremity Ankle Circles/Pumps: AAROM;Both;10 reps Short Arc Quad: PROM;Both;10 reps Heel Slides: AAROM;Both;10 reps Hip ABduction/ADduction: AAROM;Both;10 reps    General Comments        Pertinent Vitals/Pain Pain Score:  (Pt is unable to tolerate much, agitated and c/o pain)    Home Living                      Prior Function            PT Goals (current goals can now be found in the care plan section) Progress towards PT goals: Not progressing toward goals - comment (continues to be agitated with even minimal L LE movement)    Frequency    BID      PT Plan Current plan remains appropriate    Co-evaluation             End of Session  Activity Tolerance: Treatment limited secondary to agitation;Patient limited by pain Patient left: with bed alarm set;with call bell/phone within reach;with SCD's reapplied     Time: 1610-9604 PT Time Calculation (min) (ACUTE ONLY): 24 min  Charges:  $Therapeutic Exercise: 8-22 mins $Therapeutic Activity: 8-22 mins                    G Codes:      Malachi Pro, DPT 11/21/2016, 9:55 AM

## 2016-11-21 NOTE — Progress Notes (Signed)
  Subjective: 3 Days Post-Op Procedure(s) (LRB): INTRAMEDULLARY (IM) NAIL INTERTROCHANTRIC (Left) Patient reports pain as moderate.   Patient is well, and has had no acute complaints or problems. She is confused, asking why she is in hospital. Denies any CP, SOB, ABD pain. We will continue therapy today.  Plan is to go Skilled nursing facility after hospital stay.  Objective: Vital signs in last 24 hours: Temp:  [98.2 F (36.8 C)-98.7 F (37.1 C)] 98.2 F (36.8 C) (02/11 0721) Pulse Rate:  [63-78] 68 (02/11 0721) Resp:  [18] 18 (02/11 0721) BP: (102-137)/(50-64) 131/62 (02/11 0721) SpO2:  [91 %-94 %] 91 % (02/11 0721)  Intake/Output from previous day: No intake/output data recorded. Intake/Output this shift: No intake/output data recorded.   Recent Labs  11/19/16 0421 11/20/16 0310 11/21/16 0343  HGB 10.2* 9.2* 8.9*    Recent Labs  11/19/16 0421  WBC 8.2  RBC 3.32*  HCT 29.7*  PLT 140*    Recent Labs  11/20/16 0310 11/21/16 0343  NA 137 137  K 3.3* 3.9  CL 107 105  CO2 26 27  BUN 22* 21*  CREATININE 1.02* 0.85  GLUCOSE 116* 100*  CALCIUM 8.2* 8.4*   No results for input(s): LABPT, INR in the last 72 hours.  EXAM General - Patient is Alert, Appropriate and Oriented Extremity - Neurovascular intact Sensation intact distally Intact pulses distally Dorsiflexion/Plantar flexion intact No cellulitis present Compartment soft Dressing - dressing C/D/I and minimal scant serous drainage proximal incision site Motor Function - intact, moving foot and toes well on exam.   Past Medical History:  Diagnosis Date  . Alzheimer's dementia   . Pancreatitis   . Stroke (HCC)   . Vascular dementia     Assessment/Plan:   3 Days Post-Op Procedure(s) (LRB): INTRAMEDULLARY (IM) NAIL INTERTROCHANTRIC (Left) Principal Problem:   Closed left hip fracture (HCC) Active Problems:   Dementia   History of stroke   History of open reduction and internal fixation  (ORIF) procedure   Elevated blood pressure reading without diagnosis of hypertension   Acute renal insufficiency   Hypokalemia   Anemia   MRSA carrier  Estimated body mass index is 30.34 kg/m as calculated from the following:   Height as of this encounter: 5\' 2"  (1.575 m).   Weight as of this encounter: 75.3 kg (165 lb 14.4 oz). Advance diet Up with therapy  Continue to work on BM Acute post op blood loss anemia - Hgb stable at 8.9. CM to assist with discharge to SNF SNF to remove staples and apply steri strips on 12/02/16 Follow up with [facility] in 6 weeks  DVT Prophylaxis - Lovenox, Foot Pumps and TED hose Weight-Bearing as tolerated to left leg   T. Cranston Neighborhris Gaines, PA-C [facility] 11/21/2016, 8:57 AM

## 2016-11-21 NOTE — Clinical Social Work Note (Signed)
[facility] to [facility] non-emergent [facility]. The patient's son and the facility are aware. The patient is confused at baseline and seems to lack insight into her dc. However, her son is her HCPOA and is in agreement with dc today. CSW will con't to follow pending additional dc needs.  Argentina PonderKaren Martha Rian Koon, MSW, Theresia MajorsLCSWA [phone]

## 2016-11-24 LAB — METHYLMALONIC ACID, SERUM: Methylmalonic Acid, Quantitative: 673 nmol/L — ABNORMAL HIGH (ref 0–378)

## 2017-03-04 ENCOUNTER — Emergency Department: Payer: MEDICARE

## 2017-03-04 ENCOUNTER — Emergency Department
Admission: EM | Admit: 2017-03-04 | Discharge: 2017-03-04 | Disposition: A | Discharge status: Skilled Nursing Facility | Payer: MEDICARE | Attending: Emergency Medicine | Admitting: Emergency Medicine

## 2017-03-04 ENCOUNTER — Encounter: Payer: Self-pay | Admitting: *Deleted

## 2017-03-04 DIAGNOSIS — Y929 Unspecified place or not applicable: Secondary | ICD-10-CM | POA: Insufficient documentation

## 2017-03-04 DIAGNOSIS — Z79899 Other long term (current) drug therapy: Secondary | ICD-10-CM | POA: Insufficient documentation

## 2017-03-04 DIAGNOSIS — Y939 Activity, unspecified: Secondary | ICD-10-CM | POA: Insufficient documentation

## 2017-03-04 DIAGNOSIS — Z7902 Long term (current) use of antithrombotics/antiplatelets: Secondary | ICD-10-CM | POA: Diagnosis not present

## 2017-03-04 DIAGNOSIS — G309 Alzheimer's disease, unspecified: Secondary | ICD-10-CM | POA: Diagnosis not present

## 2017-03-04 DIAGNOSIS — W19XXXA Unspecified fall, initial encounter: Secondary | ICD-10-CM

## 2017-03-04 DIAGNOSIS — S29001A Unspecified injury of muscle and tendon of front wall of thorax, initial encounter: Secondary | ICD-10-CM | POA: Diagnosis present

## 2017-03-04 DIAGNOSIS — Z7901 Long term (current) use of anticoagulants: Secondary | ICD-10-CM | POA: Insufficient documentation

## 2017-03-04 DIAGNOSIS — Z91040 Latex allergy status: Secondary | ICD-10-CM | POA: Diagnosis not present

## 2017-03-04 DIAGNOSIS — Z8673 Personal history of transient ischemic attack (TIA), and cerebral infarction without residual deficits: Secondary | ICD-10-CM | POA: Insufficient documentation

## 2017-03-04 DIAGNOSIS — Y999 Unspecified external cause status: Secondary | ICD-10-CM | POA: Diagnosis not present

## 2017-03-04 DIAGNOSIS — S2020XA Contusion of thorax, unspecified, initial encounter: Secondary | ICD-10-CM | POA: Insufficient documentation

## 2017-03-04 DIAGNOSIS — W010XXA Fall on same level from slipping, tripping and stumbling without subsequent striking against object, initial encounter: Secondary | ICD-10-CM | POA: Diagnosis not present

## 2017-03-04 MED ORDER — MORPHINE SULFATE (PF) 2 MG/ML IV SOLN
2.0000 mg | Freq: Once | INTRAVENOUS | Status: AC
  Administered 2017-03-04: 2 mg via INTRAVENOUS

## 2017-03-04 MED ORDER — MORPHINE SULFATE (PF) 2 MG/ML IV SOLN
INTRAVENOUS | Status: AC
  Administered 2017-03-04: 2 mg via INTRAVENOUS
  Filled 2017-03-04: qty 1

## 2017-03-04 NOTE — ED Provider Notes (Signed)
[facility] Emergency Department Provider Note   ____________________________________________   I have reviewed the triage vital signs and the nursing notes.   HISTORY  Chief Complaint Fall   History limited by: Dementia   HPI Taylor Mack is a 83 y.o. female who presents to the emergency department today because of concerns for left chest pain after fall. Patient before she has dementia so cannot give a good history. When asked why she fell the patient states she tripped. She states she was resting to get ready to get out of the house to go to church. She landed on the left side. She states her arm was tucked under her. She denies hitting her head. The patient states that the pain is severe. It is worse with movement.   Past Medical History:  Diagnosis Date  . Alzheimer's dementia   . Pancreatitis   . Stroke (HCC)   . Vascular dementia     Patient Active Problem List   Diagnosis Date Noted  . History of open reduction and internal fixation (ORIF) procedure 11/19/2016  . Elevated blood pressure reading without diagnosis of hypertension 11/19/2016  . Acute renal insufficiency 11/19/2016  . Hypokalemia 11/19/2016  . Anemia 11/19/2016  . MRSA carrier 11/19/2016  . Closed left hip fracture (HCC) 11/17/2016  . Diverticulitis of large intestine with abscess without bleeding 09/22/2016  . Pancreatitis, acute 12/02/2014  . Dementia 12/02/2014  . History of stroke 12/02/2014  . Acute pancreatitis 12/02/2014    Past Surgical History:  Procedure Laterality Date  . ABDOMINAL HYSTERECTOMY    . APPENDECTOMY    . CHOLECYSTECTOMY    . INTRAMEDULLARY (IM) NAIL INTERTROCHANTERIC Left 11/18/2016   Procedure: INTRAMEDULLARY (IM) NAIL INTERTROCHANTRIC;  Surgeon: Christena Flake, MD;  Location: [facility] ORS;  Service: Orthopedics;  Laterality: Left;    Prior to Admission medications   Medication Sig Start Date End Date Taking? Authorizing Provider  acetaminophen  (TYLENOL) 500 MG tablet Take 500 mg by mouth every 6 (six) hours as needed.    [provider]  atorvastatin (LIPITOR) 20 MG tablet Take 20 mg by mouth daily.    [provider]  busPIRone (BUSPAR) 7.5 MG tablet  10/28/16   [provider]  calcium-vitamin D (OSCAL WITH D) 500-200 MG-UNIT per tablet Take 0.5 tablets by mouth daily.    [provider]  Chlorhexidine Gluconate Cloth 2 % PADS Apply 6 each topically daily at 6 (six) AM. 11/19/16   [name], MD  clopidogrel (PLAVIX) 75 MG tablet Take 75 mg by mouth daily.    [provider]  divalproex (DEPAKOTE SPRINKLE) 125 MG capsule Take 250 mg by mouth 3 (three) times daily.    [provider]  docusate sodium (COLACE) 100 MG capsule Take 100 mg by mouth 2 (two) times daily.    [provider]  donepezil (ARICEPT) 10 MG tablet Take 10 mg by mouth at bedtime.    [provider]  enoxaparin (LOVENOX) 40 MG/0.4ML injection Inject 0.4 mLs (40 mg total) into the skin daily. 11/19/16   [name], MD  enoxaparin (LOVENOX) 40 MG/0.4ML injection Inject 0.4 mLs (40 mg total) into the skin daily. 11/21/16 12/05/16  Evon Slack, PA-C  escitalopram (LEXAPRO) 10 MG tablet  10/18/16   [provider]  loperamide (IMODIUM) 2 MG capsule Take 2 mg by mouth as needed for diarrhea or loose stools.    [provider]  Melatonin 5 MG TABS Take 1 tablet  by mouth at bedtime.    [provider]  metoprolol succinate (TOPROL-XL) 25 MG 24 hr tablet Take 12.5 mg by mouth daily.    [provider]  mupirocin ointment (BACTROBAN) 2 % Place 1 application into the nose 2 (two) times daily. 11/19/16   [name], MD  oxyCODONE (OXY IR/ROXICODONE) 5 MG immediate release tablet Take 1-2 tablets (5-10 mg total) by mouth every 3 (three) hours as needed for breakthrough pain. 11/19/16   [name], MD  pantoprazole (PROTONIX) 40 MG tablet Take 40 mg by mouth daily.     [provider]  psyllium (HYDROCIL/METAMUCIL) 95 % PACK Take 1 packet by mouth daily.    [provider]  QUEtiapine (SEROQUEL) 25 MG tablet Take 1 tablet (25 mg total) by mouth at bedtime. Patient not taking: Reported on 11/17/2016 09/13/15   [name], MD  sertraline (ZOLOFT) 50 MG tablet Take 50 mg by mouth at bedtime.    [provider]  silver sulfADIAZINE (SILVADENE) 1 % cream Apply 1 application topically daily.    [provider]  traZODone (DESYREL) 50 MG tablet  10/18/16   [provider]  Vitamin D, Ergocalciferol, (DRISDOL) 50000 UNITS CAPS capsule Take 50,000 Units by mouth every 7 (seven) days.    [provider]    Allergies Codeine and Latex  Family History  Problem Relation Age of Onset  . Family history unknown: Yes    Social History Social History  Substance Use Topics  . Smoking status: Never Smoker  . Smokeless tobacco: Never Used  . Alcohol use No    Review of Systems Constitutional: No fever/chills Eyes: No visual changes. ENT: No sore throat. Cardiovascular: Positive for left sided chest pain. Respiratory: Denies shortness of breath. Gastrointestinal: No abdominal pain.  No nausea, no vomiting.  No diarrhea.   Genitourinary: Negative for dysuria. Musculoskeletal: Negative for back pain. Skin: Negative for rash. Neurological: Negative for headaches, focal weakness or numbness.  ____________________________________________   PHYSICAL EXAM:  VITAL SIGNS: ED Triage Vitals [03/04/17 1442]  Enc Vitals Group     BP      Pulse Rate (!) 56     Resp 16     Temp      Temp src      SpO2 96 %     Weight 139 lb (63 kg)     Height 5\' 5"  (1.651 m)   Constitutional: Awake and alert, appears uncomfortable.  Eyes: Conjunctivae are normal.  ENT   Head: Normocephalic and atraumatic.   Nose: No congestion/rhinnorhea.   Mouth/Throat: Mucous membranes are moist.   Neck: No  stridor. Hematological/Lymphatic/Immunilogical: No cervical lymphadenopathy. Cardiovascular: Normal rate, regular rhythm.  No murmurs, rubs, or gallops.  Respiratory: Normal respiratory effort without tachypnea nor retractions. Breath sounds are clear and equal bilaterally. No wheezes/rales/rhonchi. Gastrointestinal: Soft and non tender. No rebound. No guarding.  Genitourinary: Deferred Musculoskeletal: Tender to palpation over the left chest. No crepitus appreciated. No upper or lower extremity tenderness or deformity.  Neurologic:  Normal speech and language. No gross focal neurologic deficits are appreciated.  Skin:  Skin is warm, dry and intact. No rash noted. Psychiatric: Mood and affect are normal. Speech and behavior are normal. Patient exhibits appropriate insight and judgment.  ____________________________________________    LABS (pertinent positives/negatives)  Labs Reviewed - No data to display   ____________________________________________   EKG  I, Phineas Semen, attending physician, personally viewed and interpreted this EKG  EKG Time: 1451 Rate:  60 Rhythm: sinus rhythm Axis: normal Intervals: qtc 460 QRS: narrow ST changes: no st elevation Impression: normal ekg   ____________________________________________    RADIOLOGY  CT chest IMPRESSION: Aortic atherosclerosis.  Coronary artery calcifications are noted suggesting coronary artery disease.  No other significant abnormality seen in the chest.  Left forearm IMPRESSION: Negative.  ____________________________________________   PROCEDURES  Procedures  ____________________________________________   INITIAL IMPRESSION / ASSESSMENT AND PLAN / ED COURSE  Pertinent labs & imaging results that were available during my care of the patient were reviewed by me and considered in my medical decision making (see chart for details).  Patient presented to the emergency department today after a  fall. Imaging without any acute fractures. It does sound like a mechanical fall. Will discharge back to living facility.  ____________________________________________   FINAL CLINICAL IMPRESSION(S) / ED DIAGNOSES  Final diagnoses:  Fall, initial encounter  Contusion of thoracic wall, unspecified area of thoracic wall, initial encounter     Note: This dictation was prepared with Dragon dictation. Any transcriptional errors that result from this process are unintentional     Phineas SemenGoodman, Doralyn Kirkes, MD 03/04/17 1929

## 2017-03-04 NOTE — ED Triage Notes (Signed)
PT to ED from home reports left sided rib pain after a fall this afternoon. Pt is unsure of what she tripped over but denies having passed out. No head pain reported. Pt is confused at this time and disoriented to time, situation and place. Pt is only alert to self.   Pt currently reports having chest and left rib pain. No deformities noted at this time.

## 2017-03-04 NOTE — ED Notes (Signed)
Upon attempting to discharge pt, pt reported having increased pain to left forearm and verbalized pain when RN touched arm.

## 2017-03-04 NOTE — Discharge Instructions (Signed)
Please seek medical attention for any high fevers, chest pain, shortness of breath, change in behavior, persistent vomiting, bloody stool or any other new or concerning symptoms.  

## 2017-05-30 ENCOUNTER — Emergency Department: Payer: MEDICARE

## 2017-05-30 ENCOUNTER — Emergency Department
Admission: EM | Admit: 2017-05-30 | Discharge: 2017-05-30 | Disposition: A | Discharge status: Home / Self Care | Payer: MEDICARE | Attending: Emergency Medicine | Admitting: Emergency Medicine

## 2017-05-30 DIAGNOSIS — Z79899 Other long term (current) drug therapy: Secondary | ICD-10-CM | POA: Diagnosis not present

## 2017-05-30 DIAGNOSIS — Z0389 Encounter for observation for other suspected diseases and conditions ruled out: Secondary | ICD-10-CM | POA: Insufficient documentation

## 2017-05-30 DIAGNOSIS — G309 Alzheimer's disease, unspecified: Secondary | ICD-10-CM | POA: Diagnosis not present

## 2017-05-30 DIAGNOSIS — M79602 Pain in left arm: Secondary | ICD-10-CM | POA: Diagnosis present

## 2017-05-30 DIAGNOSIS — W19XXXA Unspecified fall, initial encounter: Secondary | ICD-10-CM | POA: Diagnosis not present

## 2017-05-30 DIAGNOSIS — Z7901 Long term (current) use of anticoagulants: Secondary | ICD-10-CM | POA: Diagnosis not present

## 2017-05-30 NOTE — ED Notes (Signed)
Pt had an unwitnessed fall today. Per pt son, since pt had left hip surgery she has had multiple falls due to issues with balance and mainly relys on a wheelchair but will try to get up without assistance.. Pt does keep c/o left arm pain and did point to neck and chest stating she was having some pain there . Pt also c/o back hurting .Marland Kitchen No noted injury, some old bruising noted on BL arms with colors varying from brown and yellow.Marland Kitchen

## 2017-05-30 NOTE — ED Provider Notes (Signed)
[facility] Emergency Department Provider Note  ____________________________________________   First MD Initiated Contact with Patient 05/30/17 1529     (approximate)  I have reviewed the triage vital signs and the nursing notes.   HISTORY  Chief Complaint Fall   HPI Taylor Mack is a 83 y.o. female with a history of Alzheimer's dementia as well as multiple falls was presenting to the emergency department today with an unwitnessed fall from memory care.The patient does not remember the circumstances of the fall. She was complaining of headache prior to arrival and then complained to the nurse of left-sided chest and arm pain. However, the patient denies any pain at this time. She is on Plavix. She is also accompanied by her son who is helping her make medical decisions.   Past Medical History:  Diagnosis Date  . Alzheimer's dementia   . Pancreatitis   . Stroke (HCC)   . Vascular dementia     Patient Active Problem List   Diagnosis Date Noted  . History of open reduction and internal fixation (ORIF) procedure 11/19/2016  . Elevated blood pressure reading without diagnosis of hypertension 11/19/2016  . Acute renal insufficiency 11/19/2016  . Hypokalemia 11/19/2016  . Anemia 11/19/2016  . MRSA carrier 11/19/2016  . Closed left hip fracture (HCC) 11/17/2016  . Diverticulitis of large intestine with abscess without bleeding 09/22/2016  . Pancreatitis, acute 12/02/2014  . Dementia 12/02/2014  . History of stroke 12/02/2014  . Acute pancreatitis 12/02/2014    Past Surgical History:  Procedure Laterality Date  . ABDOMINAL HYSTERECTOMY    . APPENDECTOMY    . CHOLECYSTECTOMY    . INTRAMEDULLARY (IM) NAIL INTERTROCHANTERIC Left 11/18/2016   Procedure: INTRAMEDULLARY (IM) NAIL INTERTROCHANTRIC;  Surgeon: Christena Flake, MD;  Location: [facility] ORS;  Service: Orthopedics;  Laterality: Left;    Prior to Admission medications   Medication Sig Start Date End  Date Taking? Authorizing Provider  acetaminophen (TYLENOL) 500 MG tablet Take 500 mg by mouth every 6 (six) hours as needed.    [provider]  atorvastatin (LIPITOR) 20 MG tablet Take 20 mg by mouth daily.    [provider]  calcium-vitamin D (OSCAL WITH D) 500-200 MG-UNIT per tablet Take 0.5 tablets by mouth daily.    [provider]  Chlorhexidine Gluconate Cloth 2 % PADS Apply 6 each topically daily at 6 (six) AM. 11/19/16   [name], MD  clopidogrel (PLAVIX) 75 MG tablet Take 75 mg by mouth daily.    [provider]  divalproex (DEPAKOTE SPRINKLE) 125 MG capsule Take 250 mg by mouth 2 (two) times daily.     [provider]  docusate sodium (COLACE) 100 MG capsule Take 100 mg by mouth 2 (two) times daily.    [provider]  donepezil (ARICEPT) 10 MG tablet Take 10 mg by mouth at bedtime.    [provider]  enoxaparin (LOVENOX) 40 MG/0.4ML injection Inject 0.4 mLs (40 mg total) into the skin daily. 11/19/16   [name], MD  enoxaparin (LOVENOX) 40 MG/0.4ML injection Inject 0.4 mLs (40 mg total) into the skin daily. 11/21/16 12/05/16  Evon Slack, PA-C  loperamide (IMODIUM) 2 MG capsule Take 2 mg by mouth as needed for diarrhea or loose stools.    [provider]  Melatonin 5 MG TABS Take 1 tablet by mouth at bedtime.    [provider]  metoprolol succinate (TOPROL-XL) 25 MG 24 hr tablet Take 12.5 mg by  mouth daily.    [provider]  mupirocin ointment (BACTROBAN) 2 % Place 1 application into the nose 2 (two) times daily. 11/19/16   [name], MD  oxyCODONE (OXY IR/ROXICODONE) 5 MG immediate release tablet Take 1-2 tablets (5-10 mg total) by mouth every 3 (three) hours as needed for breakthrough pain. 11/19/16   [name], MD  pantoprazole (PROTONIX) 40 MG tablet Take 40 mg by mouth daily.    [provider]  polyethylene glycol (MIRALAX / GLYCOLAX) packet Take 17 g by mouth  daily.    [provider]  psyllium (HYDROCIL/METAMUCIL) 95 % PACK Take 1 packet by mouth daily.    [provider]  QUEtiapine (SEROQUEL) 25 MG tablet Take 1 tablet (25 mg total) by mouth at bedtime. Patient not taking: Reported on 11/17/2016 09/13/15   [name], MD  sertraline (ZOLOFT) 50 MG tablet Take 50 mg by mouth at bedtime.    [provider]  silver sulfADIAZINE (SILVADENE) 1 % cream Apply 1 application topically daily.    [provider]  traZODone (DESYREL) 50 MG tablet  10/18/16   [provider]  Vitamin D, Ergocalciferol, (DRISDOL) 50000 UNITS CAPS capsule Take 50,000 Units by mouth every 7 (seven) days.    [provider]    Allergies Codeine and Latex  Family History  Problem Relation Age of Onset  . Family history unknown: Yes    Social History Social History  Substance Use Topics  . Smoking status: Never Smoker  . Smokeless tobacco: Never Used  . Alcohol use No    Review of Systems  Constitutional: No fever/chills Eyes: No visual changes. ENT: No sore throat. Cardiovascular: Denies chest pain. Respiratory: Denies shortness of breath. Gastrointestinal: No abdominal pain.  No nausea, no vomiting.  No diarrhea.  No constipation. Genitourinary: Negative for dysuria. Musculoskeletal: Negative for back pain. Skin: Negative for rash. Neurological: Negative for headaches, focal weakness or numbness.   ____________________________________________   PHYSICAL EXAM:  VITAL SIGNS: ED Triage Vitals [05/30/17 1521]  Enc Vitals Group     BP 121/87     Pulse Rate (!) 56     Resp 18     Temp 97.7 F (36.5 C)     Temp Source Oral     SpO2 100 %     Weight 175 lb (79.4 kg)     Height 5\' 4"  (1.626 m)     Head Circumference      Peak Flow      Pain Score      Pain Loc      Pain Edu?      Excl. in GC?     Constitutional: Alert and oriented. Well appearing and in no acute distress. Eyes: Conjunctivae  are normal.  Head: Atraumatic. Nose: No congestion/rhinnorhea. Mouth/Throat: Mucous membranes are moist.  Neck: No stridor.  No tenderness to the midline cervical spine. No deformity or step-off. Cardiovascular: Normal rate, regular rhythm. Grossly normal heart sounds.   Respiratory: Normal respiratory effort.  No retractions. Lungs CTAB. Gastrointestinal: Soft and nontender. No distention. No CVA tenderness. Musculoskeletal: No lower extremity tenderness nor edema.  No joint effusions.nable to fully range/flex the left hip but this isbaseline after hip fracture in the past. No tenderness to the bilateral hips. Pelvis is stable. No tenderness along the chest wall. Neurologic:  Normal speech and language. No gross focal neurologic deficits are appreciated. Skin:  Skin is warm, dry and intact. No rash noted.old appearing ecchymosis about  the left elbow and left hand. There is no tenderness and the patient has full range of motion about the left elbow and hand both actively and passively. Psychiatric: Mood and affect are normal. Speech and behavior are normal.  ____________________________________________   LABS (all labs ordered are listed, but only abnormal results are displayed)  Labs Reviewed - No data to display ____________________________________________  EKG  ED ECG REPORT I, Preesha Benjamin,  Teena Irani, the attending physician, personally viewed and interpreted this ECG.   Date: 05/30/2017  EKG Time: 1518  Rate: 59  Rhythm: normal sinus rhythm  Axis: normal  Intervals:none  ST&T Change: wandering baseline No obvious ST elevations or depressions. EKG as she reads inferior injury with ST elevation. However, this is a context of a wandering baseline. There do not appear to be any reciprocal changes.  ____________________________________________  RADIOLOGY  No acute finding on the imaging studies. ____________________________________________   PROCEDURES  Procedure(s) performed:    Procedures  Critical Care performed:   ____________________________________________   INITIAL IMPRESSION / ASSESSMENT AND PLAN / ED COURSE  Pertinent labs & imaging results that were available during my care of the patient were reviewed by me and considered in my medical decision making (see chart for details).   Er the son, the patient appears to be acting at her baseline mental status. We discussed different extents of workup to be performedhere in the emergency department. The son would prefer that the patient has imaging but does not want blood work or urine at this time. I believe this is reasonable given the patient's history.   ----------------------------------------- 4:43 PM on 05/30/2017 -----------------------------------------  Patient remains at her baseline mental status. I discussed the findings from the imaging with the son as well as the patient. The patient will be discharged home at this time. The son says that he'll be able to ride transport. Their understanding of the diagnosis as well as the treatment plan and went to comply. Patient remains without any distress or any new complaints of pain.  ____________________________________________   FINAL CLINICAL IMPRESSION(S) / ED DIAGNOSES  Fall.    NEW MEDICATIONS STARTED DURING THIS VISIT:  New Prescriptions   No medications on file     Note:  This document was prepared using Dragon voice recognition software and may include unintentional dictation errors.     Myrna Blazer, MD 05/30/17 [phone]

## 2017-05-30 NOTE — ED Triage Notes (Signed)
Pt comes into the ED via EMS from from [place] with c/o unwitnessed fall with c/o left arm pain radiating into chest and back pain. Pt has dementia with some combativeness pt has to asked multiples and each time you attempt to do anything. EMS reports pt was swinging at them in attempt to hit upon there arrival to the facility.

## 2017-06-06 ENCOUNTER — Emergency Department: Payer: MEDICARE

## 2017-06-06 ENCOUNTER — Emergency Department
Admission: EM | Admit: 2017-06-06 | Discharge: 2017-06-06 | Disposition: A | Discharge status: Home / Self Care | Payer: MEDICARE | Attending: Emergency Medicine | Admitting: Emergency Medicine

## 2017-06-06 DIAGNOSIS — Y9301 Activity, walking, marching and hiking: Secondary | ICD-10-CM | POA: Diagnosis not present

## 2017-06-06 DIAGNOSIS — Z7982 Long term (current) use of aspirin: Secondary | ICD-10-CM | POA: Insufficient documentation

## 2017-06-06 DIAGNOSIS — W108XXA Fall (on) (from) other stairs and steps, initial encounter: Secondary | ICD-10-CM | POA: Insufficient documentation

## 2017-06-06 DIAGNOSIS — Z91040 Latex allergy status: Secondary | ICD-10-CM | POA: Insufficient documentation

## 2017-06-06 DIAGNOSIS — Z7902 Long term (current) use of antithrombotics/antiplatelets: Secondary | ICD-10-CM | POA: Insufficient documentation

## 2017-06-06 DIAGNOSIS — Z79899 Other long term (current) drug therapy: Secondary | ICD-10-CM | POA: Insufficient documentation

## 2017-06-06 DIAGNOSIS — Y999 Unspecified external cause status: Secondary | ICD-10-CM | POA: Diagnosis not present

## 2017-06-06 DIAGNOSIS — S0081XA Abrasion of other part of head, initial encounter: Secondary | ICD-10-CM | POA: Diagnosis not present

## 2017-06-06 DIAGNOSIS — Y92128 Other place in nursing home as the place of occurrence of the external cause: Secondary | ICD-10-CM | POA: Insufficient documentation

## 2017-06-06 DIAGNOSIS — Z23 Encounter for immunization: Secondary | ICD-10-CM | POA: Insufficient documentation

## 2017-06-06 DIAGNOSIS — G309 Alzheimer's disease, unspecified: Secondary | ICD-10-CM | POA: Insufficient documentation

## 2017-06-06 DIAGNOSIS — W19XXXA Unspecified fall, initial encounter: Secondary | ICD-10-CM

## 2017-06-06 DIAGNOSIS — S0990XA Unspecified injury of head, initial encounter: Secondary | ICD-10-CM | POA: Diagnosis present

## 2017-06-06 MED ORDER — TETANUS-DIPHTH-ACELL PERTUSSIS 5-2.5-18.5 LF-MCG/0.5 IM SUSP
0.5000 mL | Freq: Once | INTRAMUSCULAR | Status: AC
  Administered 2017-06-06: 0.5 mL via INTRAMUSCULAR
  Filled 2017-06-06: qty 0.5

## 2017-06-06 NOTE — ED Provider Notes (Signed)
[facility] Emergency Department Provider Note  ____________________________________________  Time seen: Approximately 5:44 PM  I have reviewed the triage vital signs and the nursing notes.   HISTORY  Chief Complaint Fall  Level 5 Caveat: Portions of the History and Physical were unable to be obtained due to altered mental status from chronic dementia.   HPI Taylor Mack is a 83 y.o. female brought to the ED due to a fall today. The patient states that she was stepping up onto the patio at her nursing home and her foot caught the edge causing her to fall forward. She hit her for head on a brick wall. No loss of consciousness. No secondary injuries. Patient denies any pain at this time. No vision changes numbness tingling weakness or neck pain. She denies any associated pain that caused her to fall or any other symptoms recently. Does have a history of frequent falls related to attempting to walk more than she is really able to. She normally uses a wheelchair.     Past Medical History:  Diagnosis Date  . Alzheimer's dementia   . Pancreatitis   . Stroke (HCC)   . Vascular dementia      Patient Active Problem List   Diagnosis Date Noted  . History of open reduction and internal fixation (ORIF) procedure 11/19/2016  . Elevated blood pressure reading without diagnosis of hypertension 11/19/2016  . Acute renal insufficiency 11/19/2016  . Hypokalemia 11/19/2016  . Anemia 11/19/2016  . MRSA carrier 11/19/2016  . Closed left hip fracture (HCC) 11/17/2016  . Diverticulitis of large intestine with abscess without bleeding 09/22/2016  . Pancreatitis, acute 12/02/2014  . Dementia 12/02/2014  . History of stroke 12/02/2014  . Acute pancreatitis 12/02/2014     Past Surgical History:  Procedure Laterality Date  . ABDOMINAL HYSTERECTOMY    . APPENDECTOMY    . CHOLECYSTECTOMY    . INTRAMEDULLARY (IM) NAIL INTERTROCHANTERIC Left 11/18/2016   Procedure:  INTRAMEDULLARY (IM) NAIL INTERTROCHANTRIC;  Surgeon: Christena Flake, MD;  Location: [facility] ORS;  Service: Orthopedics;  Laterality: Left;     Prior to Admission medications   Medication Sig Start Date End Date Taking? Authorizing Provider  acetaminophen (TYLENOL) 500 MG tablet Take 500 mg by mouth every 6 (six) hours as needed.    [provider]  atorvastatin (LIPITOR) 20 MG tablet Take 20 mg by mouth daily.    [provider]  calcium-vitamin D (OSCAL WITH D) 500-200 MG-UNIT per tablet Take 0.5 tablets by mouth daily.    [provider]  Chlorhexidine Gluconate Cloth 2 % PADS Apply 6 each topically daily at 6 (six) AM. 11/19/16   [name], MD  clopidogrel (PLAVIX) 75 MG tablet Take 75 mg by mouth daily.    [provider]  divalproex (DEPAKOTE SPRINKLE) 125 MG capsule Take 250 mg by mouth 2 (two) times daily.     [provider]  docusate sodium (COLACE) 100 MG capsule Take 100 mg by mouth 2 (two) times daily.    [provider]  donepezil (ARICEPT) 10 MG tablet Take 10 mg by mouth at bedtime.    [provider]  enoxaparin (LOVENOX) 40 MG/0.4ML injection Inject 0.4 mLs (40 mg total) into the skin daily. 11/19/16   [name], MD  enoxaparin (LOVENOX) 40 MG/0.4ML injection Inject 0.4 mLs (40 mg total) into the skin daily. 11/21/16 12/05/16  Evon Slack, PA-C  loperamide (IMODIUM) 2 MG capsule Take 2 mg by mouth as  needed for diarrhea or loose stools.    [provider]  Melatonin 5 MG TABS Take 1 tablet by mouth at bedtime.    [provider]  metoprolol succinate (TOPROL-XL) 25 MG 24 hr tablet Take 12.5 mg by mouth daily.    [provider]  mupirocin ointment (BACTROBAN) 2 % Place 1 application into the nose 2 (two) times daily. 11/19/16   [name], MD  oxyCODONE (OXY IR/ROXICODONE) 5 MG immediate release tablet Take 1-2 tablets (5-10 mg total) by mouth every 3 (three) hours as needed for  breakthrough pain. 11/19/16   [name], MD  pantoprazole (PROTONIX) 40 MG tablet Take 40 mg by mouth daily.    [provider]  polyethylene glycol (MIRALAX / GLYCOLAX) packet Take 17 g by mouth daily.    [provider]  psyllium (HYDROCIL/METAMUCIL) 95 % PACK Take 1 packet by mouth daily.    [provider]  QUEtiapine (SEROQUEL) 25 MG tablet Take 1 tablet (25 mg total) by mouth at bedtime. Patient not taking: Reported on 11/17/2016 09/13/15   [name], MD  sertraline (ZOLOFT) 50 MG tablet Take 50 mg by mouth at bedtime.    [provider]  silver sulfADIAZINE (SILVADENE) 1 % cream Apply 1 application topically daily.    [provider]  traZODone (DESYREL) 50 MG tablet  10/18/16   [provider]  Vitamin D, Ergocalciferol, (DRISDOL) 50000 UNITS CAPS capsule Take 50,000 Units by mouth every 7 (seven) days.    [provider]     Allergies Codeine and Latex   Family History  Problem Relation Age of Onset  . Family history unknown: Yes    Social History Social History  Substance Use Topics  . Smoking status: Never Smoker  . Smokeless tobacco: Never Used  . Alcohol use No    Review of Systems  Constitutional:   No fever or chills.  ENT:   No sore throat. No rhinorrhea. Cardiovascular:   No chest pain or syncope. Respiratory:   No dyspnea or cough. Gastrointestinal:   Negative for abdominal pain Musculoskeletal:   Negative for focal pain  All other systems reviewed and are negative except as documented above in ROS and HPI.  ____________________________________________   PHYSICAL EXAM:  VITAL SIGNS: ED Triage Vitals [06/06/17 1545]  Enc Vitals Group     BP (!) 196/153     Pulse Rate 68     Resp 18     Temp 97.8 F (36.6 C)     Temp Source Oral     SpO2 98 %     Weight      Height 5\' 4"  (1.626 m)     Head Circumference      Peak Flow      Pain Score      Pain Loc      Pain Edu?       Excl. in GC?     Vital signs reviewed, nursing assessments reviewed.   Constitutional:   Alert and orientedTo person. Well appearing and in no distress. Eyes:   No scleral icterus.  EOMI. No nystagmus. No conjunctival pallor. PERRL. ENT   Head:   Normocephalic with small superficial abrasion over the nasal bridge. No hemotympanum. Facial bones stable and nontender   Nose:   No congestion/rhinnorhea. No epistaxis or septal hematoma   Mouth/Throat:   MMM, no pharyngeal erythema. No peritonsillar mass. No intraoral injury   Neck:   No meningismus. Full ROM.  No midline tenderness Hematological/Lymphatic/Immunilogical:   No cervical lymphadenopathy. Cardiovascular:   RRR. Symmetric bilateral radial and DP pulses.  No murmurs.  Respiratory:   Normal respiratory effort without tachypnea/retractions. Breath sounds are clear and equal bilaterally. No wheezes/rales/rhonchi. Gastrointestinal:   Soft and nontender. Non distended. There is no CVA tenderness.  No rebound, rigidity, or guarding. Genitourinary:   deferred Musculoskeletal:   Normal range of motion in all extremities. No joint effusions.  No lower extremity tenderness.  No edema. No midline spinal tenderness Neurologic:   Normal speech and language.  Motor grossly intact. No gross focal neurologic deficits are appreciated.  Skin:    Skin is warm, dry and intact. No rash noted.  No petechiae, purpura, or bullae.  ____________________________________________    LABS (pertinent positives/negatives) (all labs ordered are listed, but only abnormal results are displayed) Labs Reviewed - No data to display ____________________________________________   EKG  Interpreted by me Sinus rhythm rate of 57, normal axis and intervals. QRS ST segments and T waves.  ____________________________________________    RADIOLOGY  Ct Head Wo Contrast  Result Date: 06/06/2017 CLINICAL DATA:  Unwitnessed fall, nasal laceration,  suspected intracranial venous injury EXAM: CT HEAD WITHOUT CONTRAST CT CERVICAL SPINE WITHOUT CONTRAST TECHNIQUE: Multidetector CT imaging of the head and cervical spine was performed following the standard protocol without intravenous contrast. Multiplanar CT image reconstructions of the cervical spine were also generated. COMPARISON:  CT head 05/30/2017 FINDINGS: CT HEAD FINDINGS Brain: Generalized atrophy. Mild asymmetric dilatation of the occipital horn of the RIGHT lateral ventricle, unchanged. Otherwise normal ventricular morphology. No midline shift or mass effect. Small vessel chronic ischemic changes of deep cerebral white matter. No intracranial hemorrhage, mass lesion, evidence of acute infarction, or extra-axial fluid collection. Vascular: Atherosclerotic calcification of internal carotid and vertebral arteries bilaterally at skullbase Skull: Intact Sinuses/Orbits: Clear Other: N/A CT CERVICAL SPINE FINDINGS Alignment: Mild anterolisthesis at C4-C5 approximately 2.4 mm. Remaining alignment normal. Skull base and vertebrae: Visualized skullbase intact. Bones demineralized. Multilevel facet degenerative changes. Degenerative disc disease changes at C5-C6 and C6-C7. Vertebral body heights maintained without fracture or subluxation. Soft tissues and spinal canal: Prevertebral soft tissues normal thickness. Atherosclerotic calcification of the carotid arteries bilaterally especially at the bifurcations. Disc levels: Disc space narrowing with endplate spur formation at C5-C6 and C6-C7. No definite disc herniation. Upper chest: Lung apices clear. Other: N/A IMPRESSION: Atrophy with small vessel chronic ischemic changes of deep cerebral white matter. No acute intracranial abnormalities. Mild degenerative disc and facet disease changes scattered throughout cervical spine as above. No acute cervical spine abnormalities. Electronically Signed   By: Ulyses Southward M.D.   On: 06/06/2017 16:45   Ct Cervical Spine Wo  Contrast  Result Date: 06/06/2017 CLINICAL DATA:  Unwitnessed fall, nasal laceration, suspected intracranial venous injury EXAM: CT HEAD WITHOUT CONTRAST CT CERVICAL SPINE WITHOUT CONTRAST TECHNIQUE: Multidetector CT imaging of the head and cervical spine was performed following the standard protocol without intravenous contrast. Multiplanar CT image reconstructions of the cervical spine were also generated. COMPARISON:  CT head 05/30/2017 FINDINGS: CT HEAD FINDINGS Brain: Generalized atrophy. Mild asymmetric dilatation of the occipital horn of the RIGHT lateral ventricle, unchanged. Otherwise normal ventricular morphology. No midline shift or mass effect. Small vessel chronic ischemic changes of deep cerebral white matter. No intracranial hemorrhage, mass lesion, evidence of acute infarction, or extra-axial fluid collection. Vascular: Atherosclerotic calcification of internal carotid and vertebral arteries bilaterally at skullbase Skull: Intact Sinuses/Orbits: Clear Other: N/A CT CERVICAL SPINE FINDINGS Alignment:  Mild anterolisthesis at C4-C5 approximately 2.4 mm. Remaining alignment normal. Skull base and vertebrae: Visualized skullbase intact. Bones demineralized. Multilevel facet degenerative changes. Degenerative disc disease changes at C5-C6 and C6-C7. Vertebral body heights maintained without fracture or subluxation. Soft tissues and spinal canal: Prevertebral soft tissues normal thickness. Atherosclerotic calcification of the carotid arteries bilaterally especially at the bifurcations. Disc levels: Disc space narrowing with endplate spur formation at C5-C6 and C6-C7. No definite disc herniation. Upper chest: Lung apices clear. Other: N/A IMPRESSION: Atrophy with small vessel chronic ischemic changes of deep cerebral white matter. No acute intracranial abnormalities. Mild degenerative disc and facet disease changes scattered throughout cervical spine as above. No acute cervical spine abnormalities.  Electronically Signed   By: Ulyses Southward M.D.   On: 06/06/2017 16:45    ____________________________________________   PROCEDURES Procedures  ____________________________________________   INITIAL IMPRESSION / ASSESSMENT AND PLAN / ED COURSE  Pertinent labs & imaging results that were available during my care of the patient were reviewed by me and considered in my medical decision making (see chart for details).    Clinical Course as of Jun 07 1743  Mon Jun 06, 2017  1621 P/w fall, with abrasion to nasal bridge. Baseline dementia. Hpi c/w mech. Fall. Exam otherwise unremarkable. Ct head/neck, ekg.  [PS]    Clinical Course User Index [PS] Sharman Cheek, MD     ----------------------------------------- 5:44 PM on 06/06/2017 -----------------------------------------  Workup negative. Patient well-appearing, baseline mental status, neurologically intact. Suitable for discharge home. Tetanus updated in the ED today.  ____________________________________________   FINAL CLINICAL IMPRESSION(S) / ED DIAGNOSES  Final diagnoses:  Fall, initial encounter  Facial abrasion, initial encounter      New Prescriptions   No medications on file     Portions of this note were generated with dragon dictation software. Dictation errors may occur despite best attempts at proofreading.    Sharman Cheek, MD 06/06/17 [phone]

## 2017-06-06 NOTE — Discharge Instructions (Signed)
Your CT scans today were unremarkable. Please follow up with your doctor for further monitoring of your symptoms. Results for orders placed or performed during the hospital encounter of 11/17/16  Surgical pcr screen  Result Value Ref Range   MRSA, PCR POSITIVE (A) NEGATIVE   Staphylococcus aureus POSITIVE (A) NEGATIVE  CBC  Result Value Ref Range   WBC 7.1 3.6 - 11.0 K/uL   RBC 4.27 3.80 - 5.20 MIL/uL   Hemoglobin 13.4 12.0 - 16.0 g/dL   HCT 16.1 09.6 - 04.5 %   MCV 87.4 80.0 - 100.0 fL   MCH 31.3 26.0 - 34.0 pg   MCHC 35.8 32.0 - 36.0 g/dL   RDW 40.9 81.1 - 91.4 %   Platelets 159 150 - 440 K/uL  Basic metabolic panel  Result Value Ref Range   Sodium 136 135 - 145 mmol/L   Potassium 3.3 (L) 3.5 - 5.1 mmol/L   Chloride 103 101 - 111 mmol/L   CO2 22 22 - 32 mmol/L   Glucose, Bld 123 (H) 65 - 99 mg/dL   BUN 20 6 - 20 mg/dL   Creatinine, Ser 7.82 (H) 0.44 - 1.00 mg/dL   Calcium 9.1 8.9 - 95.6 mg/dL   GFR calc non Af Amer 45 (L) >60 mL/min   GFR calc Af Amer 52 (L) >60 mL/min   Anion gap 11 5 - 15  Urinalysis, Routine w reflex microscopic  Result Value Ref Range   Color, Urine YELLOW (A) YELLOW   APPearance CLEAR (A) CLEAR   Specific Gravity, Urine 1.014 1.005 - 1.030   pH 7.0 5.0 - 8.0   Glucose, UA NEGATIVE NEGATIVE mg/dL   Hgb urine dipstick NEGATIVE NEGATIVE   Bilirubin Urine NEGATIVE NEGATIVE   Ketones, ur NEGATIVE NEGATIVE mg/dL   Protein, ur NEGATIVE NEGATIVE mg/dL   Nitrite NEGATIVE NEGATIVE   Leukocytes, UA NEGATIVE NEGATIVE  Basic metabolic panel  Result Value Ref Range   Sodium 136 135 - 145 mmol/L   Potassium 4.1 3.5 - 5.1 mmol/L   Chloride 104 101 - 111 mmol/L   CO2 25 22 - 32 mmol/L   Glucose, Bld 147 (H) 65 - 99 mg/dL   BUN 22 (H) 6 - 20 mg/dL   Creatinine, Ser 2.13 (H) 0.44 - 1.00 mg/dL   Calcium 8.8 (L) 8.9 - 10.3 mg/dL   GFR calc non Af Amer 45 (L) >60 mL/min   GFR calc Af Amer 53 (L) >60 mL/min   Anion gap 7 5 - 15  CBC  Result Value Ref Range   WBC 8.5 3.6 - 11.0 K/uL   RBC 3.88 3.80 - 5.20 MIL/uL   Hemoglobin 11.9 (L) 12.0 - 16.0 g/dL   HCT 08.6 (L) 57.8 - 46.9 %   MCV 88.0 80.0 - 100.0 fL   MCH 30.7 26.0 - 34.0 pg   MCHC 34.9 32.0 - 36.0 g/dL   RDW 62.9 52.8 - 41.3 %   Platelets 145 (L) 150 - 440 K/uL  Methylmalonic acid, serum  Result Value Ref Range   Methylmalonic Acid, Quantitative 673 (H) 0 - 378 nmol/L  Basic metabolic panel  Result Value Ref Range   Sodium 136 135 - 145 mmol/L   Potassium 4.1 3.5 - 5.1 mmol/L   Chloride 105 101 - 111 mmol/L   CO2 25 22 - 32 mmol/L   Glucose, Bld 153 (H) 65 - 99 mg/dL   BUN 22 (H) 6 - 20 mg/dL   Creatinine, Ser 2.44 (H)  0.44 - 1.00 mg/dL   Calcium 8.0 (L) 8.9 - 10.3 mg/dL   GFR calc non Af Amer 44 (L) >60 mL/min   GFR calc Af Amer 51 (L) >60 mL/min   Anion gap 6 5 - 15  CBC with Differential/Platelet  Result Value Ref Range   WBC 8.2 3.6 - 11.0 K/uL   RBC 3.32 (L) 3.80 - 5.20 MIL/uL   Hemoglobin 10.2 (L) 12.0 - 16.0 g/dL   HCT 66.5 (L) 99.3 - 57.0 %   MCV 89.5 80.0 - 100.0 fL   MCH 30.7 26.0 - 34.0 pg   MCHC 34.3 32.0 - 36.0 g/dL   RDW 17.7 93.9 - 03.0 %   Platelets 140 (L) 150 - 440 K/uL   Neutrophils Relative % 83 %   Neutro Abs 6.8 (H) 1.4 - 6.5 K/uL   Lymphocytes Relative 7 %   Lymphs Abs 0.6 (L) 1.0 - 3.6 K/uL   Monocytes Relative 10 %   Monocytes Absolute 0.8 0.2 - 0.9 K/uL   Eosinophils Relative 0 %   Eosinophils Absolute 0.0 0 - 0.7 K/uL   Basophils Relative 0 %   Basophils Absolute 0.0 0 - 0.1 K/uL  Vitamin B12  Result Value Ref Range   Vitamin B-12 182 180 - 914 pg/mL  Basic metabolic panel  Result Value Ref Range   Sodium 137 135 - 145 mmol/L   Potassium 3.3 (L) 3.5 - 5.1 mmol/L   Chloride 107 101 - 111 mmol/L   CO2 26 22 - 32 mmol/L   Glucose, Bld 116 (H) 65 - 99 mg/dL   BUN 22 (H) 6 - 20 mg/dL   Creatinine, Ser 0.92 (H) 0.44 - 1.00 mg/dL   Calcium 8.2 (L) 8.9 - 10.3 mg/dL   GFR calc non Af Amer 50 (L) >60 mL/min   GFR calc Af Amer 58 (L) >60  mL/min   Anion gap 4 (L) 5 - 15  Hemoglobin  Result Value Ref Range   Hemoglobin 9.2 (L) 12.0 - 16.0 g/dL  Basic metabolic panel  Result Value Ref Range   Sodium 137 135 - 145 mmol/L   Potassium 3.9 3.5 - 5.1 mmol/L   Chloride 105 101 - 111 mmol/L   CO2 27 22 - 32 mmol/L   Glucose, Bld 100 (H) 65 - 99 mg/dL   BUN 21 (H) 6 - 20 mg/dL   Creatinine, Ser 3.30 0.44 - 1.00 mg/dL   Calcium 8.4 (L) 8.9 - 10.3 mg/dL   GFR calc non Af Amer >60 >60 mL/min   GFR calc Af Amer >60 >60 mL/min   Anion gap 5 5 - 15  Hemoglobin  Result Value Ref Range   Hemoglobin 8.9 (L) 12.0 - 16.0 g/dL   Dg Chest 1 View  Result Date: 05/30/2017 CLINICAL DATA:  Fall. EXAM: CHEST 1 VIEW COMPARISON:  CT 03/04/2017.  Chest x-ray 11/17/2016. FINDINGS: Mediastinum and hilar structures are normal. Heart size normal. Low lung volumes with mild basilar atelectasis and/or scarring. Stable mild interstitial prominence No pleural effusion or pneumothorax . No acute bony abnormality. IMPRESSION: 1.  Low lung volumes with mild basilar atelectasis and/or scarring. 2.  Mild stable cardiomegaly.  No pulmonary venous congestion. 3. No acute bony abnormality Electronically Signed   By: Maisie Fus  Register   On: 05/30/2017 16:24   Dg Pelvis 1-2 Views  Result Date: 05/30/2017 CLINICAL DATA:  83 year old female with unwitnessed fall and pelvic pain. Initial encounter. EXAM: PELVIS - 1-2 VIEW COMPARISON:  12/02/2014  radiographs FINDINGS: No acute fracture, subluxation or dislocation identified. Intramedullary rod and screw within the proximal left femur noted. No suspicious focal bony lesions are present. Mild to moderate degenerative disc disease in the lower lumbar spine noted. IMPRESSION: No evidence of acute abnormality. Electronically Signed   By: Harmon Pier M.D.   On: 05/30/2017 16:21   Ct Head Wo Contrast  Result Date: 06/06/2017 CLINICAL DATA:  Unwitnessed fall, nasal laceration, suspected intracranial venous injury EXAM: CT HEAD  WITHOUT CONTRAST CT CERVICAL SPINE WITHOUT CONTRAST TECHNIQUE: Multidetector CT imaging of the head and cervical spine was performed following the standard protocol without intravenous contrast. Multiplanar CT image reconstructions of the cervical spine were also generated. COMPARISON:  CT head 05/30/2017 FINDINGS: CT HEAD FINDINGS Brain: Generalized atrophy. Mild asymmetric dilatation of the occipital horn of the RIGHT lateral ventricle, unchanged. Otherwise normal ventricular morphology. No midline shift or mass effect. Small vessel chronic ischemic changes of deep cerebral white matter. No intracranial hemorrhage, mass lesion, evidence of acute infarction, or extra-axial fluid collection. Vascular: Atherosclerotic calcification of internal carotid and vertebral arteries bilaterally at skullbase Skull: Intact Sinuses/Orbits: Clear Other: N/A CT CERVICAL SPINE FINDINGS Alignment: Mild anterolisthesis at C4-C5 approximately 2.4 mm. Remaining alignment normal. Skull base and vertebrae: Visualized skullbase intact. Bones demineralized. Multilevel facet degenerative changes. Degenerative disc disease changes at C5-C6 and C6-C7. Vertebral body heights maintained without fracture or subluxation. Soft tissues and spinal canal: Prevertebral soft tissues normal thickness. Atherosclerotic calcification of the carotid arteries bilaterally especially at the bifurcations. Disc levels: Disc space narrowing with endplate spur formation at C5-C6 and C6-C7. No definite disc herniation. Upper chest: Lung apices clear. Other: N/A IMPRESSION: Atrophy with small vessel chronic ischemic changes of deep cerebral white matter. No acute intracranial abnormalities. Mild degenerative disc and facet disease changes scattered throughout cervical spine as above. No acute cervical spine abnormalities. Electronically Signed   By: Ulyses Southward M.D.   On: 06/06/2017 16:45   Ct Head Wo Contrast  Result Date: 05/30/2017 CLINICAL DATA:  Unwitnessed  fall. EXAM: CT HEAD WITHOUT CONTRAST TECHNIQUE: Contiguous axial images were obtained from the base of the skull through the vertex without intravenous contrast. COMPARISON:  July 22, 2016 FINDINGS: Brain: No subdural, epidural, or subarachnoid hemorrhage. No mass effect or midline shift. Moderate white matter changes are stable. Ventricles and sulci are prominent but unchanged. Cerebellum, brainstem, and basal cisterns are normal. No acute cortical ischemia or infarct. Vascular: Calcified atherosclerosis is seen in the intracranial carotid arteries. Skull: Normal. Negative for fracture or focal lesion. Sinuses/Orbits: No acute finding. Other: None. IMPRESSION: Chronic white matter changes.  No acute intracranial abnormality. Electronically Signed   By: Gerome Sam III M.D   On: 05/30/2017 16:06   Ct Cervical Spine Wo Contrast  Result Date: 06/06/2017 CLINICAL DATA:  Unwitnessed fall, nasal laceration, suspected intracranial venous injury EXAM: CT HEAD WITHOUT CONTRAST CT CERVICAL SPINE WITHOUT CONTRAST TECHNIQUE: Multidetector CT imaging of the head and cervical spine was performed following the standard protocol without intravenous contrast. Multiplanar CT image reconstructions of the cervical spine were also generated. COMPARISON:  CT head 05/30/2017 FINDINGS: CT HEAD FINDINGS Brain: Generalized atrophy. Mild asymmetric dilatation of the occipital horn of the RIGHT lateral ventricle, unchanged. Otherwise normal ventricular morphology. No midline shift or mass effect. Small vessel chronic ischemic changes of deep cerebral white matter. No intracranial hemorrhage, mass lesion, evidence of acute infarction, or extra-axial fluid collection. Vascular: Atherosclerotic calcification of internal carotid and vertebral arteries bilaterally at skullbase Skull:  Intact Sinuses/Orbits: Clear Other: N/A CT CERVICAL SPINE FINDINGS Alignment: Mild anterolisthesis at C4-C5 approximately 2.4 mm. Remaining alignment  normal. Skull base and vertebrae: Visualized skullbase intact. Bones demineralized. Multilevel facet degenerative changes. Degenerative disc disease changes at C5-C6 and C6-C7. Vertebral body heights maintained without fracture or subluxation. Soft tissues and spinal canal: Prevertebral soft tissues normal thickness. Atherosclerotic calcification of the carotid arteries bilaterally especially at the bifurcations. Disc levels: Disc space narrowing with endplate spur formation at C5-C6 and C6-C7. No definite disc herniation. Upper chest: Lung apices clear. Other: N/A IMPRESSION: Atrophy with small vessel chronic ischemic changes of deep cerebral white matter. No acute intracranial abnormalities. Mild degenerative disc and facet disease changes scattered throughout cervical spine as above. No acute cervical spine abnormalities. Electronically Signed   By: Ulyses Southward M.D.   On: 06/06/2017 16:45

## 2017-06-06 NOTE — ED Triage Notes (Signed)
Pt arrived via EMS from [facility] for a unwitnessed fall. Pt has a laceration on the nose. No complaints of pain. EMS was not able to obtain VS or BG because pt not cooperative. Pt here for observation.

## 2017-07-14 ENCOUNTER — Emergency Department: Payer: MEDICARE

## 2017-07-14 ENCOUNTER — Emergency Department
Admission: EM | Admit: 2017-07-14 | Discharge: 2017-07-14 | Disposition: A | Discharge status: Home / Self Care | Payer: MEDICARE | Attending: Emergency Medicine | Admitting: Emergency Medicine

## 2017-07-14 DIAGNOSIS — Y998 Other external cause status: Secondary | ICD-10-CM | POA: Diagnosis not present

## 2017-07-14 DIAGNOSIS — S7002XA Contusion of left hip, initial encounter: Secondary | ICD-10-CM | POA: Insufficient documentation

## 2017-07-14 DIAGNOSIS — W19XXXA Unspecified fall, initial encounter: Secondary | ICD-10-CM | POA: Diagnosis not present

## 2017-07-14 DIAGNOSIS — Z8673 Personal history of transient ischemic attack (TIA), and cerebral infarction without residual deficits: Secondary | ICD-10-CM | POA: Diagnosis not present

## 2017-07-14 DIAGNOSIS — S0990XA Unspecified injury of head, initial encounter: Secondary | ICD-10-CM | POA: Insufficient documentation

## 2017-07-14 DIAGNOSIS — G308 Other Alzheimer's disease: Secondary | ICD-10-CM | POA: Diagnosis not present

## 2017-07-14 DIAGNOSIS — S0001XA Abrasion of scalp, initial encounter: Secondary | ICD-10-CM | POA: Insufficient documentation

## 2017-07-14 DIAGNOSIS — Y9301 Activity, walking, marching and hiking: Secondary | ICD-10-CM | POA: Insufficient documentation

## 2017-07-14 DIAGNOSIS — Y929 Unspecified place or not applicable: Secondary | ICD-10-CM | POA: Diagnosis not present

## 2017-07-14 DIAGNOSIS — M25532 Pain in left wrist: Secondary | ICD-10-CM | POA: Diagnosis not present

## 2017-07-14 DIAGNOSIS — S7012XA Contusion of left thigh, initial encounter: Secondary | ICD-10-CM | POA: Insufficient documentation

## 2017-07-14 MED ORDER — ACETAMINOPHEN 500 MG PO TABS
1000.0000 mg | ORAL_TABLET | Freq: Once | ORAL | Status: AC
  Administered 2017-07-14: 1000 mg via ORAL

## 2017-07-14 MED ORDER — ACETAMINOPHEN 500 MG PO TABS
ORAL_TABLET | ORAL | Status: AC
  Filled 2017-07-14: qty 2

## 2017-07-14 NOTE — ED Notes (Signed)
Patient's son declined vital sign recheck.

## 2017-07-14 NOTE — ED Triage Notes (Signed)
Pt was walking unassisted and fell, co left wrist and hand pain, also co right forehead pain. Pt has hx of dementia recent hx of hip fx.

## 2017-07-14 NOTE — ED Notes (Signed)
Pt c/o left hip pain to her son while in waiting room. Pt had previously broken the same hip. Triage nurse made aware and reports pt had no difficulty transferring from vehicle to wheelchair.

## 2017-07-14 NOTE — ED Provider Notes (Signed)
[facility] Emergency Department Provider Note       Time seen: ----------------------------------------- 9:15 PM on 07/14/2017 -----------------------------------------     I have reviewed the triage vital signs and the nursing notes.   HISTORY   Chief Complaint Fall    HPI Taylor Mack is a 83 y.o. female who presents to the ED for injuries from a fall. Patient was walking unassisted and fell. She was complaining of some left wrist and hand pain. She also had an abrasion noted to her right temporal area. She has a history of dementia and recent history of left-sided hip fracture status post repair. She is complaining of pain 8 out of 10 in the hip.  Past Medical History:  Diagnosis Date  . Alzheimer's dementia   . Pancreatitis   . Stroke (HCC)   . Vascular dementia     Patient Active Problem List   Diagnosis Date Noted  . History of open reduction and internal fixation (ORIF) procedure 11/19/2016  . Elevated blood pressure reading without diagnosis of hypertension 11/19/2016  . Acute renal insufficiency 11/19/2016  . Hypokalemia 11/19/2016  . Anemia 11/19/2016  . MRSA carrier 11/19/2016  . Closed left hip fracture (HCC) 11/17/2016  . Diverticulitis of large intestine with abscess without bleeding 09/22/2016  . Pancreatitis, acute 12/02/2014  . Dementia 12/02/2014  . History of stroke 12/02/2014  . Acute pancreatitis 12/02/2014    Past Surgical History:  Procedure Laterality Date  . ABDOMINAL HYSTERECTOMY    . APPENDECTOMY    . CHOLECYSTECTOMY    . INTRAMEDULLARY (IM) NAIL INTERTROCHANTERIC Left 11/18/2016   Procedure: INTRAMEDULLARY (IM) NAIL INTERTROCHANTRIC;  Surgeon: Christena Flake, MD;  Location: [facility] ORS;  Service: Orthopedics;  Laterality: Left;    Allergies Codeine and Latex  Social History Social History  Substance Use Topics  . Smoking status: Never Smoker  . Smokeless tobacco: Never Used  . Alcohol use No    Review  of Systems Constitutional: Negative for fever. Cardiovascular: Negative for chest pain. Respiratory: Negative for shortness of breath. Gastrointestinal: Negative for abdominal pain, vomiting and diarrhea. Musculoskeletal: positive for hip pain, wrist pain Skin: positive for contusions Neurological: Negative for headaches, focal weakness or numbness.  All systems negative/normal/unremarkable except as stated in the HPI  ____________________________________________   PHYSICAL EXAM:  VITAL SIGNS: ED Triage Vitals  Enc Vitals Group     BP 07/14/17 1912 (!) 162/79     Pulse Rate 07/14/17 1908 (!) 56     Resp 07/14/17 1908 20     Temp 07/14/17 1908 98.3 F (36.8 C)     Temp Source 07/14/17 1908 Oral     SpO2 07/14/17 1908 100 %     Weight 07/14/17 1909 160 lb (72.6 kg)     Height --      Head Circumference --      Peak Flow --      Pain Score 07/14/17 1906 8     Pain Loc --      Pain Edu? --      Excl. in GC? --     Constitutional: Alert but disoriented, Well appearing and in no distress. Eyes: Conjunctivae are normal. Normal extraocular movements. ENT   Head: Normocephalic, small right temporal contusion   Nose: No congestion/rhinnorhea.   Mouth/Throat: Mucous membranes are moist.   Neck: No stridor. Cardiovascular: Normal rate, regular rhythm. No murmurs, rubs, or gallops. Respiratory: Normal respiratory effort without tachypnea nor retractions. Breath sounds are clear and equal  bilaterally. No wheezes/rales/rhonchi. Gastrointestinal: Soft and nontender. Normal bowel sounds Musculoskeletal:tenderness over the left hip, large hematomas noted. Minimal tenderness of the left hand and wrist Neurologic:  Normal speech and language. No gross focal neurologic deficits are appreciated.  Skin:  Skin is warm, dry and intact. No rash noted. Psychiatric: Mood and affect are normal. Speech and behavior are normal.  ____________________________________________  ED  COURSE:  Pertinent labs & imaging results that were available during my care of the patient were reviewed by me and considered in my medical decision making (see chart for details). Patient presents for a fall, we will assess with imaging as indicated.   Procedures ____________________________________________   RADIOLOGY Images were viewed by me  x-rays of the left wrist and hand are negative. CT of the head and C-spine is negative X-rays of left hip are negative for any bony abnormality, there is soft tissue swelling  ____________________________________________  DIFFERENTIAL DIAGNOSIS   contusion, fracture, dislocation, sprain, subdural, epidural, skull fracture   FINAL ASSESSMENT AND PLAN  fall, minor head injury, contusion   Plan: Patient's  imaging was dictated above. Patient had presented for a fall at only sustained superficial injuries. X-rays were all negative here. II have encouraged ice pack and follow-up with her doctor as needed. Symptomatically treatment only.   Emily Filbert, MD   Note: This note was generated in part or whole with voice recognition software. Voice recognition is usually quite accurate but there are transcription errors that can and very often do occur. I apologize for any typographical errors that were not detected and corrected.     Emily Filbert, MD 07/14/17 [phone]

## 2017-09-06 ENCOUNTER — Encounter: Payer: Self-pay | Admitting: Emergency Medicine

## 2017-09-06 ENCOUNTER — Emergency Department: Payer: MEDICARE

## 2017-09-06 ENCOUNTER — Other Ambulatory Visit: Payer: Self-pay

## 2017-09-06 ENCOUNTER — Emergency Department
Admission: EM | Admit: 2017-09-06 | Discharge: 2017-09-06 | Disposition: A | Discharge status: Home / Self Care | Payer: MEDICARE | Attending: Emergency Medicine | Admitting: Emergency Medicine

## 2017-09-06 DIAGNOSIS — Y999 Unspecified external cause status: Secondary | ICD-10-CM | POA: Insufficient documentation

## 2017-09-06 DIAGNOSIS — Z8673 Personal history of transient ischemic attack (TIA), and cerebral infarction without residual deficits: Secondary | ICD-10-CM | POA: Insufficient documentation

## 2017-09-06 DIAGNOSIS — Z7901 Long term (current) use of anticoagulants: Secondary | ICD-10-CM | POA: Diagnosis not present

## 2017-09-06 DIAGNOSIS — F0150 Vascular dementia without behavioral disturbance: Secondary | ICD-10-CM | POA: Diagnosis not present

## 2017-09-06 DIAGNOSIS — Z79899 Other long term (current) drug therapy: Secondary | ICD-10-CM | POA: Insufficient documentation

## 2017-09-06 DIAGNOSIS — S0990XA Unspecified injury of head, initial encounter: Secondary | ICD-10-CM

## 2017-09-06 DIAGNOSIS — W19XXXA Unspecified fall, initial encounter: Secondary | ICD-10-CM | POA: Insufficient documentation

## 2017-09-06 DIAGNOSIS — Y92192 Bathroom in other specified residential institution as the place of occurrence of the external cause: Secondary | ICD-10-CM | POA: Insufficient documentation

## 2017-09-06 DIAGNOSIS — Y939 Activity, unspecified: Secondary | ICD-10-CM | POA: Diagnosis not present

## 2017-09-06 DIAGNOSIS — G309 Alzheimer's disease, unspecified: Secondary | ICD-10-CM | POA: Insufficient documentation

## 2017-09-06 DIAGNOSIS — Z91040 Latex allergy status: Secondary | ICD-10-CM | POA: Diagnosis not present

## 2017-09-06 NOTE — ED Provider Notes (Signed)
[facility] Emergency Department Provider Note   ____________________________________________    I have reviewed the triage vital signs and the nursing notes.   HISTORY  Chief Complaint Fall   History limited by dementia  HPI Taylor Mack is a 83 y.o. female who presents after unwitnessed fall.  Apparently patient was found down in the bathroom, complains of mild pain to the left leg.  She denies neck pain.  She does have a small amount of swelling on the lateral head but denies headache or vision changes.  Moving all extremities.  Denies chest pain denies shortness of breath.  Denies palpitations.  History of frequent falls  Past Medical History:  Diagnosis Date  . Alzheimer's dementia   . Pancreatitis   . Stroke (HCC)   . Vascular dementia     Patient Active Problem List   Diagnosis Date Noted  . History of open reduction and internal fixation (ORIF) procedure 11/19/2016  . Elevated blood pressure reading without diagnosis of hypertension 11/19/2016  . Acute renal insufficiency 11/19/2016  . Hypokalemia 11/19/2016  . Anemia 11/19/2016  . MRSA carrier 11/19/2016  . Closed left hip fracture (HCC) 11/17/2016  . Diverticulitis of large intestine with abscess without bleeding 09/22/2016  . Pancreatitis, acute 12/02/2014  . Dementia 12/02/2014  . History of stroke 12/02/2014  . Acute pancreatitis 12/02/2014    Past Surgical History:  Procedure Laterality Date  . ABDOMINAL HYSTERECTOMY    . APPENDECTOMY    . CHOLECYSTECTOMY    . INTRAMEDULLARY (IM) NAIL INTERTROCHANTERIC Left 11/18/2016   Procedure: INTRAMEDULLARY (IM) NAIL INTERTROCHANTRIC;  Surgeon: Christena Flake, MD;  Location: [facility] ORS;  Service: Orthopedics;  Laterality: Left;    Prior to Admission medications   Medication Sig Start Date End Date Taking? Authorizing Provider  acetaminophen (TYLENOL) 500 MG tablet Take 500-1,000 mg by mouth every 8 (eight) hours. GIVE 1 TABLET (500MG )  BY MOUTH THREE TIMES A DAY FOR PAIN - MAY GIVE 1 ADDITIONAL TABLET EVERY 8 HOURS AS NEEDED   Yes [provider]  atorvastatin (LIPITOR) 20 MG tablet Take 20 mg by mouth daily.   Yes [provider]  calcium-vitamin D (OSCAL WITH D) 500-200 MG-UNIT per tablet Take 0.5 tablets by mouth daily.   Yes [provider]  clopidogrel (PLAVIX) 75 MG tablet Take 75 mg by mouth daily.   Yes [provider]  divalproex (DEPAKOTE SPRINKLE) 125 MG capsule Take 250 mg by mouth 3 (three) times daily.    Yes [provider]  Lorazepam POWD Apply 1 application topically every 8 (eight) hours as needed (AGITATION). (LORAZEPAM 0.5MG /ML GEL)   Yes [provider]  metoprolol tartrate (LOPRESSOR) 25 MG tablet Take 12.5 mg by mouth daily.   Yes [provider]  pantoprazole (PROTONIX) 40 MG tablet Take 40 mg by mouth daily.   Yes [provider]  polyethylene glycol (MIRALAX / GLYCOLAX) packet Take 17 g by mouth daily.   Yes [provider]  QUEtiapine (SEROQUEL) 25 MG tablet Take 1 tablet (25 mg total) by mouth at bedtime. 09/13/15  Yes [name], MD  sennosides-docusate sodium (SENOKOT-S) 8.6-50 MG tablet Take 2 tablets by mouth daily.   Yes [provider]  Chlorhexidine Gluconate Cloth 2 % PADS Apply 6 each topically daily at 6 (six) AM. Patient not taking: Reported on 09/06/2017 11/19/16   [name], MD  donepezil (ARICEPT) 10 MG tablet Take 10 mg by mouth at bedtime.  [provider]  enoxaparin (LOVENOX) 40 MG/0.4ML injection Inject 0.4 mLs (40 mg total) into the skin daily. Patient not taking: Reported on 09/06/2017 11/19/16   Katharina CaperVaickute, Rima, MD  Melatonin 5 MG TABS Take 1 tablet by mouth at bedtime.    [provider]  mupirocin ointment (BACTROBAN) 2 % Place 1 application into the nose 2 (two) times daily. Patient not taking: Reported on 09/06/2017 11/19/16   Katharina CaperVaickute, Rima, MD  oxyCODONE (OXY  IR/ROXICODONE) 5 MG immediate release tablet Take 1-2 tablets (5-10 mg total) by mouth every 3 (three) hours as needed for breakthrough pain. Patient not taking: Reported on 09/06/2017 11/19/16   Katharina CaperVaickute, Rima, MD  sertraline (ZOLOFT) 50 MG tablet Take 50 mg by mouth at bedtime.    [provider]  traZODone (DESYREL) 50 MG tablet  10/18/16   [provider]  Vitamin D, Ergocalciferol, (DRISDOL) 50000 UNITS CAPS capsule Take 50,000 Units by mouth every 30 (thirty) days.     [provider]     Allergies Codeine and Latex  Family History  Family history unknown: Yes    Social History Social History   Tobacco Use  . Smoking status: Never Smoker  . Smokeless tobacco: Never Used  Substance Use Topics  . Alcohol use: No    Alcohol/week: 0.0 oz  . Drug use: No    Review of Systems  Constitutional: Denies dizziness Eyes: No visual changes.  ENT: Denies neck pain Cardiovascular: Denies chest pain. Respiratory: Denies shortness of breath. Gastrointestinal: No abdominal pain.  No nausea, no vomiting.   Genitourinary: Negative for dysuria. Musculoskeletal: Reports left knee pain Skin: Negative for rash. Neurological: Negative for headaches   ____________________________________________   PHYSICAL EXAM:  VITAL SIGNS: ED Triage Vitals  Enc Vitals Group     BP 09/06/17 1204 131/72     Pulse Rate 09/06/17 1204 60     Resp 09/06/17 1204 16     Temp 09/06/17 1204 98.2 F (36.8 C)     Temp Source 09/06/17 1204 Oral     SpO2 09/06/17 1204 98 %     Weight 09/06/17 1205 69.9 kg (154 lb 1.6 oz)     Height 09/06/17 1205 1.676 m (5\' 6" )     Head Circumference --      Peak Flow --      Pain Score --      Pain Loc --      Pain Edu? --      Excl. in GC? --     Constitutional: Alert  No acute distress. Eyes: Conjunctivae are normal.  Head: Small amount of swelling left lateral forehead, no bony a normalities Nose: No epistaxis Mouth/Throat: Mucous  membranes are moist.   Neck:  Painless ROM, no vertebral tenderness to palpation  Cardiovascular: Normal rate, regular rhythm. Grossly normal heart sounds.  Good peripheral circulation. Respiratory: Normal respiratory effort.  No retractions. Lungs CTAB.  No chest wall tenderness to palpation Gastrointestinal: Soft and nontender. No distention.  No CVA tenderness. Genitourinary: deferred Musculoskeletal: Moves all extremities very well.  No pain with axial load of both hips.  Full range of motion of both knees without pain, no significant swelling of joints. Neurologic:  Normal speech and language. No gross focal neurologic deficits are appreciated.  Skin:  Skin is warm, dry and intact. No rash noted. Psychiatric: Mood and affect are normal. Speech and behavior are normal.  ____________________________________________   LABS (all labs ordered are listed, but only abnormal results  are displayed)  Labs Reviewed - No data to display ____________________________________________  EKG  None ____________________________________________  RADIOLOGY  CT head unremarkable Pelvis x-ray negative for fracture ____________________________________________   PROCEDURES  Procedure(s) performed: No  Procedures   Critical Care performed: No ____________________________________________   INITIAL IMPRESSION / ASSESSMENT AND PLAN / ED COURSE  Pertinent labs & imaging results that were available during my care of the patient were reviewed by me and considered in my medical decision making (see chart for details).  Patient well-appearing with reassuring exam.  Will image head and pelvis to rule out bony injury.  I do not feel lab work is necessary at this time given normal vitals and reassuring exam.    ____________________________________________   FINAL CLINICAL IMPRESSION(S) / ED DIAGNOSES  Final diagnoses:  Fall, initial encounter  Injury of head, initial encounter         Note:  This document was prepared using Dragon voice recognition software and may include unintentional dictation errors.    Jene EveryKinner, Rawn Quiroa, MD 09/06/17 [phone]

## 2017-09-06 NOTE — Discharge Instructions (Signed)
Your head CT and xray were normal. Please take precautions at home to avoid falls

## 2017-09-06 NOTE — ED Notes (Signed)
Per [place] at [facility], no transportation available upon patient's discharge. Spoke with Leonette Mostharles, patient's son and updated him on results and discharge plan. Son states he is unavailable to transport patient at this time and gave verbal consent for EMS to transport. Secretary to notify EMS for pickup.

## 2017-09-06 NOTE — ED Notes (Signed)
[facility]   [id number]

## 2017-09-06 NOTE — ED Notes (Signed)
Pt appeared to be trying to get out of bed, went in and talked to pt. Pt stated that she was "just stretching her legs." Showed pt call bell and how to use it and assured pt that this tech or RN would check on her in a few.

## 2017-09-06 NOTE — ED Triage Notes (Signed)
Patient from home via [facility]. Per EMS, patient had unwitnessed fall in bathroom. Complaining of pain to left arm and leg. Patient able to move independently in bed. Red, raised area noted left side of face. Patient oriented at baseline per facility. Alert to person only upon arrival.

## 2017-09-06 NOTE — ED Notes (Signed)
Ambulated patient to toilet with one assist.  Patient tolerated well.  patient dressed in clothing in anticipation of discharge.  Returned to stretcher, bed alarm reattached.  Continue to monitor.

## 2017-09-06 NOTE — ED Notes (Signed)
AAOx3.  Skin warm and dry.  NAD 

## 2017-11-05 ENCOUNTER — Encounter: Payer: Self-pay | Admitting: Emergency Medicine

## 2017-11-05 ENCOUNTER — Emergency Department: Payer: MEDICARE

## 2017-11-05 ENCOUNTER — Other Ambulatory Visit: Payer: Self-pay

## 2017-11-05 ENCOUNTER — Emergency Department
Admission: EM | Admit: 2017-11-05 | Discharge: 2017-11-05 | Disposition: A | Discharge status: Skilled Nursing Facility | Payer: MEDICARE | Attending: Emergency Medicine | Admitting: Emergency Medicine

## 2017-11-05 DIAGNOSIS — S3992XA Unspecified injury of lower back, initial encounter: Secondary | ICD-10-CM | POA: Diagnosis not present

## 2017-11-05 DIAGNOSIS — Z79899 Other long term (current) drug therapy: Secondary | ICD-10-CM | POA: Diagnosis not present

## 2017-11-05 DIAGNOSIS — W19XXXA Unspecified fall, initial encounter: Secondary | ICD-10-CM | POA: Insufficient documentation

## 2017-11-05 DIAGNOSIS — Y999 Unspecified external cause status: Secondary | ICD-10-CM | POA: Insufficient documentation

## 2017-11-05 DIAGNOSIS — S199XXA Unspecified injury of neck, initial encounter: Secondary | ICD-10-CM | POA: Insufficient documentation

## 2017-11-05 DIAGNOSIS — G309 Alzheimer's disease, unspecified: Secondary | ICD-10-CM | POA: Diagnosis not present

## 2017-11-05 DIAGNOSIS — Y92128 Other place in nursing home as the place of occurrence of the external cause: Secondary | ICD-10-CM | POA: Diagnosis not present

## 2017-11-05 DIAGNOSIS — Y939 Activity, unspecified: Secondary | ICD-10-CM | POA: Insufficient documentation

## 2017-11-05 DIAGNOSIS — Z7902 Long term (current) use of antithrombotics/antiplatelets: Secondary | ICD-10-CM | POA: Diagnosis not present

## 2017-11-05 NOTE — ED Triage Notes (Signed)
Pt presents to ED via [facility] from [facility] c/o unwitnessed fall. SNF staff state they saw, left her and returned within a few minutes to find pt on the floor. EMS report pain to L post head. Hx R hip fracture. Pt alert, confused at baseline, unable to give detailed history.

## 2017-11-05 NOTE — ED Notes (Signed)

## 2017-11-05 NOTE — Discharge Instructions (Signed)
Please seek medical attention for any high fevers, chest pain, shortness of breath, change in behavior, persistent vomiting, bloody stool or any other new or concerning symptoms.  

## 2017-11-05 NOTE — ED Provider Notes (Signed)
[facility] Emergency Department Provider Note   ____________________________________________   I have reviewed the triage vital signs and the nursing notes.   HISTORY  Chief Complaint Fall   History limited by and level 5 caveat due to dementia   HPI Taylor Mack is a 84 y.o. female who presents to the emergency department today via EMS from living facility after an unwitnessed fall. Patient complaining of some right lower back pain, right hip pain. The patient is not oriented to events.  Per medical record review patient has a history of dementia  Past Medical History:  Diagnosis Date  . Alzheimer's dementia   . Pancreatitis   . Stroke (HCC)   . Vascular dementia     Patient Active Problem List   Diagnosis Date Noted  . History of open reduction and internal fixation (ORIF) procedure 11/19/2016  . Elevated blood pressure reading without diagnosis of hypertension 11/19/2016  . Acute renal insufficiency 11/19/2016  . Hypokalemia 11/19/2016  . Anemia 11/19/2016  . MRSA carrier 11/19/2016  . Closed left hip fracture (HCC) 11/17/2016  . Diverticulitis of large intestine with abscess without bleeding 09/22/2016  . Pancreatitis, acute 12/02/2014  . Dementia 12/02/2014  . History of stroke 12/02/2014  . Acute pancreatitis 12/02/2014    Past Surgical History:  Procedure Laterality Date  . ABDOMINAL HYSTERECTOMY    . APPENDECTOMY    . CHOLECYSTECTOMY    . INTRAMEDULLARY (IM) NAIL INTERTROCHANTERIC Left 11/18/2016   Procedure: INTRAMEDULLARY (IM) NAIL INTERTROCHANTRIC;  Surgeon: Christena FlakeJohn J Poggi, MD;  Location: [facility] ORS;  Service: Orthopedics;  Laterality: Left;    Prior to Admission medications   Medication Sig Start Date End Date Taking? Authorizing Provider  acetaminophen (TYLENOL) 500 MG tablet Take 500-1,000 mg by mouth every 8 (eight) hours. GIVE 1 TABLET (500MG ) BY MOUTH THREE TIMES A DAY FOR PAIN - MAY GIVE 1 ADDITIONAL TABLET EVERY 8 HOURS AS  NEEDED   Yes [provider]  atorvastatin (LIPITOR) 20 MG tablet Take 20 mg by mouth daily.   Yes [provider]  calcium-vitamin D (OSCAL WITH D) 500-200 MG-UNIT per tablet Take 0.5 tablets by mouth daily.   Yes [provider]  clopidogrel (PLAVIX) 75 MG tablet Take 75 mg by mouth daily.   Yes [provider]  divalproex (DEPAKOTE SPRINKLE) 125 MG capsule Take 250 mg by mouth 3 (three) times daily.    Yes [provider]  donepezil (ARICEPT) 10 MG tablet Take 10 mg by mouth at bedtime.   Yes [provider]  Lorazepam POWD Apply 1 application topically every 8 (eight) hours as needed (AGITATION). (LORAZEPAM 0.5MG /ML GEL)   Yes [provider]  Melatonin 5 MG TABS Take 1 tablet by mouth at bedtime.   Yes [provider]  metoprolol tartrate (LOPRESSOR) 25 MG tablet Take 12.5 mg by mouth daily.   Yes [provider]  pantoprazole (PROTONIX) 40 MG tablet Take 40 mg by mouth daily.   Yes [provider]  polyethylene glycol (MIRALAX / GLYCOLAX) packet Take 17 g by mouth daily.   Yes [provider]  QUEtiapine (SEROQUEL) 25 MG tablet Take 1 tablet (25 mg total) by mouth at bedtime. 09/13/15  Yes Darien RamusKaminski, David W, MD  sennosides-docusate sodium (SENOKOT-S) 8.6-50 MG tablet Take 2 tablets by mouth daily.   Yes [provider]  sertraline (ZOLOFT) 50 MG tablet Take 50 mg by mouth at bedtime.   Yes [provider]  traZODone (DESYREL) 50  MG tablet  10/18/16  Yes [provider]  traZODone (DESYREL) 50 MG tablet Take 25 mg by mouth daily as needed (agitation).   Yes [provider]  Vitamin D, Ergocalciferol, (DRISDOL) 50000 UNITS CAPS capsule Take 50,000 Units by mouth every 30 (thirty) days.    Yes [provider]  Chlorhexidine Gluconate Cloth 2 % PADS Apply 6 each topically daily at 6 (six) AM. Patient not taking: Reported on 09/06/2017 11/19/16   [name], MD  enoxaparin (LOVENOX) 40 MG/0.4ML injection Inject 0.4 mLs (40 mg total) into the skin daily. Patient not taking: Reported on 09/06/2017 11/19/16   [name], MD  mupirocin ointment (BACTROBAN) 2 % Place 1 application into the nose 2 (two) times daily. Patient not taking: Reported on 09/06/2017 11/19/16   [name], MD  oxyCODONE (OXY IR/ROXICODONE) 5 MG immediate release tablet Take 1-2 tablets (5-10 mg total) by mouth every 3 (three) hours as needed for breakthrough pain. Patient not taking: Reported on 09/06/2017 11/19/16   [name], MD    Allergies Codeine and Latex  Family History  Family history unknown: Yes    Social History Social History   Tobacco Use  . Smoking status: Never Smoker  . Smokeless tobacco: Never Used  Substance Use Topics  . Alcohol use: No    Alcohol/week: 0.0 oz  . Drug use: No    Review of Systems Unable to obtain reliable ROS given dementia  ____________________________________________   PHYSICAL EXAM:  VITAL SIGNS: ED Triage Vitals [11/05/17 1740]  Enc Vitals Group     BP 139/79     Pulse Rate (!) 53     Resp 16     Temp 97.8 F (36.6 C)     Temp Source Oral     SpO2 100 %     Weight 155 lb (70.3 kg)     Height 5\' 5"  (1.651 m)   Constitutional: Alert not oriented. No distress. Eyes: Conjunctivae are normal.  ENT   Head: Normocephalic and atraumatic.   Nose: No congestion/rhinnorhea.   Mouth/Throat: Mucous membranes are moist.   Neck: No stridor. No midline tenderness. Hematological/Lymphatic/Immunilogical: No cervical lymphadenopathy. Cardiovascular: Normal rate, regular rhythm.  No murmurs, rubs, or gallops.  Respiratory: Normal respiratory effort without tachypnea nor retractions. Breath sounds are clear and equal bilaterally. No wheezes/rales/rhonchi. Gastrointestinal: Soft and non tender. No rebound. No guarding.  Genitourinary: Deferred Musculoskeletal: Normal range of motion in all  extremities. Some tenderness to palpation around the right hip however no tenderness to rotation.  Neurologic:  Normal speech. Demented. No gross focal neurologic deficits are appreciated.  Skin:  Skin is warm, dry and intact. No rash noted. Psychiatric: Mood and affect are normal. Speech and behavior are normal. Patient exhibits appropriate insight and judgment.  ____________________________________________    LABS (pertinent positives/negatives)  None  ____________________________________________   EKG  None  ____________________________________________    RADIOLOGY  CT head/cervical spine No acute fracture or bleed  Right hip No fracture   ____________________________________________   PROCEDURES  Procedures  ____________________________________________   INITIAL IMPRESSION / ASSESSMENT AND PLAN / ED COURSE  Pertinent labs & imaging results that were available during my care of the patient were reviewed by me and considered in my medical decision making (see chart for details).  Patient presented to the emergency department today because of concerns for unwitnessed fall from living facility.  Patient herself is demented and cannot give any history although was complaining of some right hip pain.  CT head cervical spine and hip did not show any fractures.  I did have a discussion with the son once he arrived to the emergency department.  Did discuss possibly obtaining further workup including blood work.  Patient said however felt comfortable foregoing this at this time.  I think this is reasonable.  Will discharge back to living facility.   ____________________________________________   FINAL CLINICAL IMPRESSION(S) / ED DIAGNOSES  Final diagnoses:  Fall, initial encounter     Note: This dictation was prepared with Dragon dictation. Any transcriptional errors that result from this process are unintentional     Phineas Semen, MD 11/06/17 [phone]

## 2017-11-05 NOTE — ED Notes (Signed)
[facility] notified of pt discharge and plan of care, as well as that pt is returning with son via POV.

## 2017-12-15 ENCOUNTER — Encounter: Payer: Self-pay | Admitting: Emergency Medicine

## 2017-12-15 ENCOUNTER — Emergency Department: Payer: MEDICARE

## 2017-12-15 ENCOUNTER — Inpatient Hospital Stay
Admission: EM | Admit: 2017-12-15 | Discharge: 2017-12-18 | DRG: 683 | Disposition: A | Discharge status: Home Health | Payer: MEDICARE | Attending: Internal Medicine | Admitting: Internal Medicine

## 2017-12-15 ENCOUNTER — Other Ambulatory Visit: Payer: Self-pay

## 2017-12-15 DIAGNOSIS — F0150 Vascular dementia without behavioral disturbance: Secondary | ICD-10-CM | POA: Diagnosis present

## 2017-12-15 DIAGNOSIS — G309 Alzheimer's disease, unspecified: Secondary | ICD-10-CM | POA: Diagnosis present

## 2017-12-15 DIAGNOSIS — N179 Acute kidney failure, unspecified: Principal | ICD-10-CM | POA: Diagnosis present

## 2017-12-15 DIAGNOSIS — Z1612 Extended spectrum beta lactamase (ESBL) resistance: Secondary | ICD-10-CM | POA: Diagnosis present

## 2017-12-15 DIAGNOSIS — F0280 Dementia in other diseases classified elsewhere without behavioral disturbance: Secondary | ICD-10-CM | POA: Diagnosis present

## 2017-12-15 DIAGNOSIS — J101 Influenza due to other identified influenza virus with other respiratory manifestations: Secondary | ICD-10-CM

## 2017-12-15 DIAGNOSIS — Z7902 Long term (current) use of antithrombotics/antiplatelets: Secondary | ICD-10-CM | POA: Diagnosis not present

## 2017-12-15 DIAGNOSIS — F0281 Dementia in other diseases classified elsewhere with behavioral disturbance: Secondary | ICD-10-CM

## 2017-12-15 DIAGNOSIS — B9620 Unspecified Escherichia coli [E. coli] as the cause of diseases classified elsewhere: Secondary | ICD-10-CM | POA: Diagnosis present

## 2017-12-15 DIAGNOSIS — Z8673 Personal history of transient ischemic attack (TIA), and cerebral infarction without residual deficits: Secondary | ICD-10-CM

## 2017-12-15 DIAGNOSIS — E860 Dehydration: Secondary | ICD-10-CM | POA: Diagnosis present

## 2017-12-15 DIAGNOSIS — N390 Urinary tract infection, site not specified: Secondary | ICD-10-CM | POA: Diagnosis present

## 2017-12-15 DIAGNOSIS — F02818 Dementia in other diseases classified elsewhere, unspecified severity, with other behavioral disturbance: Secondary | ICD-10-CM

## 2017-12-15 DIAGNOSIS — G308 Other Alzheimer's disease: Secondary | ICD-10-CM

## 2017-12-15 DIAGNOSIS — N3090 Cystitis, unspecified without hematuria: Secondary | ICD-10-CM

## 2017-12-15 LAB — GASTROINTESTINAL PANEL BY PCR, STOOL (REPLACES STOOL CULTURE)

## 2017-12-15 LAB — COMPREHENSIVE METABOLIC PANEL
ALBUMIN: 3.9 g/dL (ref 3.5–5.0)
ALK PHOS: 70 U/L (ref 38–126)
ALT: 31 U/L (ref 14–54)
AST: 55 U/L — AB (ref 15–41)
Anion gap: 11 (ref 5–15)
BILIRUBIN TOTAL: 0.9 mg/dL (ref 0.3–1.2)
BUN: 25 mg/dL — ABNORMAL HIGH (ref 6–20)
CALCIUM: 9.3 mg/dL (ref 8.9–10.3)
CO2: 25 mmol/L (ref 22–32)
CREATININE: 1.71 mg/dL — AB (ref 0.44–1.00)
Chloride: 103 mmol/L (ref 101–111)
GFR calc Af Amer: 31 mL/min — ABNORMAL LOW (ref >60)
GFR calc non Af Amer: 27 mL/min — ABNORMAL LOW (ref >60)
GLUCOSE: 123 mg/dL — AB (ref 65–99)
Potassium: 4.1 mmol/L (ref 3.5–5.1)
Sodium: 139 mmol/L (ref 135–145)
TOTAL PROTEIN: 7.8 g/dL (ref 6.5–8.1)

## 2017-12-15 LAB — URINALYSIS, COMPLETE (UACMP) WITH MICROSCOPIC
BILIRUBIN URINE: NEGATIVE
Glucose, UA: NEGATIVE mg/dL
KETONES UR: NEGATIVE mg/dL
Nitrite: NEGATIVE
Protein, ur: 30 mg/dL — AB
SPECIFIC GRAVITY, URINE: 1.021 (ref 1.005–1.030)
pH: 5 (ref 5.0–8.0)

## 2017-12-15 LAB — CBC WITH DIFFERENTIAL/PLATELET
BASOS PCT: 1 %
Basophils Absolute: 0 10*3/uL (ref 0–0.1)
EOS ABS: 0 10*3/uL (ref 0–0.7)
Eosinophils Relative: 1 %
HEMATOCRIT: 42.5 % (ref 35.0–47.0)
Hemoglobin: 14 g/dL (ref 12.0–16.0)
Lymphocytes Relative: 23 %
Lymphs Abs: 1 10*3/uL (ref 1.0–3.6)
MCH: 29.6 pg (ref 26.0–34.0)
MCHC: 33 g/dL (ref 32.0–36.0)
MCV: 89.7 fL (ref 80.0–100.0)
MONOS PCT: 17 %
Monocytes Absolute: 0.7 10*3/uL (ref 0.2–0.9)
NEUTROS ABS: 2.5 10*3/uL (ref 1.4–6.5)
Neutrophils Relative %: 58 %
Platelets: 129 10*3/uL — ABNORMAL LOW (ref 150–440)
RBC: 4.74 MIL/uL (ref 3.80–5.20)
RDW: 14.2 % (ref 11.5–14.5)
WBC: 4.2 10*3/uL (ref 3.6–11.0)

## 2017-12-15 LAB — MRSA PCR SCREENING: MRSA by PCR: NEGATIVE

## 2017-12-15 LAB — LIPASE, BLOOD: Lipase: 27 U/L (ref 11–51)

## 2017-12-15 LAB — C DIFFICILE QUICK SCREEN W PCR REFLEX
C DIFFICILE (CDIFF) INTERP: NOT DETECTED
C DIFFICLE (CDIFF) ANTIGEN: NEGATIVE
C Diff toxin: NEGATIVE

## 2017-12-15 LAB — INFLUENZA PANEL BY PCR (TYPE A & B)
INFLBPCR: NEGATIVE
Influenza A By PCR: POSITIVE — AB

## 2017-12-15 MED ORDER — QUETIAPINE FUMARATE 25 MG PO TABS
25.0000 mg | ORAL_TABLET | Freq: Every day | ORAL | Status: DC
  Administered 2017-12-15 – 2017-12-17 (×3): 25 mg via ORAL
  Filled 2017-12-15 (×3): qty 1

## 2017-12-15 MED ORDER — OSELTAMIVIR PHOSPHATE 30 MG PO CAPS
30.0000 mg | ORAL_CAPSULE | ORAL | Status: DC
  Filled 2017-12-15: qty 1

## 2017-12-15 MED ORDER — ONDANSETRON HCL 4 MG/2ML IJ SOLN: 4.0000 mg | Freq: Four times a day (QID) | INTRAMUSCULAR | Status: DC | PRN

## 2017-12-15 MED ORDER — LORAZEPAM 0.5 MG PO TABS: 0.5000 mg | ORAL_TABLET | Freq: Three times a day (TID) | ORAL | Status: DC | PRN

## 2017-12-15 MED ORDER — IOPAMIDOL (ISOVUE-300) INJECTION 61%
30.0000 mL | Freq: Once | INTRAVENOUS | Status: AC | PRN
  Administered 2017-12-15: 30 mL via ORAL

## 2017-12-15 MED ORDER — PANTOPRAZOLE SODIUM 40 MG PO TBEC
40.0000 mg | DELAYED_RELEASE_TABLET | Freq: Every day | ORAL | Status: DC
  Administered 2017-12-16 – 2017-12-18 (×3): 40 mg via ORAL
  Filled 2017-12-15 (×3): qty 1

## 2017-12-15 MED ORDER — POLYETHYLENE GLYCOL 3350 17 G PO PACK
17.0000 g | PACK | Freq: Every day | ORAL | Status: DC
  Administered 2017-12-16: 17 g via ORAL
  Filled 2017-12-15 (×2): qty 1

## 2017-12-15 MED ORDER — ACETAMINOPHEN 325 MG PO TABS
650.0000 mg | ORAL_TABLET | Freq: Four times a day (QID) | ORAL | Status: DC | PRN
  Administered 2017-12-18: 650 mg via ORAL
  Filled 2017-12-15: qty 2

## 2017-12-15 MED ORDER — TRAZODONE HCL 50 MG PO TABS
50.0000 mg | ORAL_TABLET | Freq: Every day | ORAL | Status: DC
  Administered 2017-12-15 – 2017-12-17 (×3): 50 mg via ORAL
  Filled 2017-12-15 (×3): qty 1

## 2017-12-15 MED ORDER — CLOPIDOGREL BISULFATE 75 MG PO TABS
75.0000 mg | ORAL_TABLET | Freq: Every day | ORAL | Status: DC
  Administered 2017-12-16 – 2017-12-18 (×3): 75 mg via ORAL
  Filled 2017-12-15 (×3): qty 1

## 2017-12-15 MED ORDER — VITAMIN D (ERGOCALCIFEROL) 1.25 MG (50000 UNIT) PO CAPS
50000.0000 [IU] | ORAL_CAPSULE | ORAL | Status: DC
Start: 2017-12-23 — End: 2017-12-18

## 2017-12-15 MED ORDER — ONDANSETRON HCL 4 MG PO TABS: 4.0000 mg | ORAL_TABLET | Freq: Four times a day (QID) | ORAL | Status: DC | PRN

## 2017-12-15 MED ORDER — PIPERACILLIN-TAZOBACTAM 3.375 G IVPB
3.3750 g | Freq: Three times a day (TID) | INTRAVENOUS | Status: DC
  Administered 2017-12-15 – 2017-12-17 (×6): 3.375 g via INTRAVENOUS
  Filled 2017-12-15 (×6): qty 50

## 2017-12-15 MED ORDER — CALCIUM CARBONATE-VITAMIN D 500-200 MG-UNIT PO TABS
1.0000 | ORAL_TABLET | Freq: Every day | ORAL | Status: DC
  Administered 2017-12-15 – 2017-12-18 (×4): 1 via ORAL
  Filled 2017-12-15 (×4): qty 1

## 2017-12-15 MED ORDER — CHLORHEXIDINE GLUCONATE CLOTH 2 % EX PADS: 6.0000 | MEDICATED_PAD | Freq: Every day | CUTANEOUS | Status: DC

## 2017-12-15 MED ORDER — ATORVASTATIN CALCIUM 20 MG PO TABS
20.0000 mg | ORAL_TABLET | Freq: Every day | ORAL | Status: DC
  Administered 2017-12-15 – 2017-12-17 (×3): 20 mg via ORAL
  Filled 2017-12-15 (×3): qty 1

## 2017-12-15 MED ORDER — SODIUM CHLORIDE 0.9 % IV SOLN
INTRAVENOUS | Status: DC
  Administered 2017-12-15 – 2017-12-18 (×5): via INTRAVENOUS

## 2017-12-15 MED ORDER — ACETAMINOPHEN 650 MG RE SUPP: 650.0000 mg | Freq: Four times a day (QID) | RECTAL | Status: DC | PRN

## 2017-12-15 MED ORDER — SENNOSIDES-DOCUSATE SODIUM 8.6-50 MG PO TABS
2.0000 | ORAL_TABLET | Freq: Every day | ORAL | Status: DC
  Administered 2017-12-16: 2 via ORAL
  Filled 2017-12-15 (×2): qty 2

## 2017-12-15 MED ORDER — MELATONIN 5 MG PO TABS
1.0000 | ORAL_TABLET | Freq: Every day | ORAL | Status: DC
  Administered 2017-12-15 – 2017-12-17 (×3): 5 mg via ORAL
  Filled 2017-12-15 (×4): qty 1

## 2017-12-15 MED ORDER — ACETAMINOPHEN 160 MG/5ML PO SUSP
480.0000 mg | Freq: Three times a day (TID) | ORAL | Status: DC
  Administered 2017-12-15 – 2017-12-16 (×5): 480 mg via ORAL
  Filled 2017-12-15 (×8): qty 15

## 2017-12-15 MED ORDER — SODIUM CHLORIDE 0.9 % IV BOLUS (SEPSIS)
1000.0000 mL | Freq: Once | INTRAVENOUS | Status: AC
  Administered 2017-12-15: 1000 mL via INTRAVENOUS

## 2017-12-15 MED ORDER — DIVALPROEX SODIUM 125 MG PO CSDR
250.0000 mg | DELAYED_RELEASE_CAPSULE | Freq: Three times a day (TID) | ORAL | Status: DC
  Administered 2017-12-15 – 2017-12-18 (×9): 250 mg via ORAL
  Filled 2017-12-15 (×11): qty 2

## 2017-12-15 MED ORDER — DONEPEZIL HCL 5 MG PO TABS
10.0000 mg | ORAL_TABLET | Freq: Every day | ORAL | Status: DC
  Administered 2017-12-15 – 2017-12-17 (×3): 10 mg via ORAL
  Filled 2017-12-15 (×4): qty 2

## 2017-12-15 MED ORDER — OSELTAMIVIR PHOSPHATE 75 MG PO CAPS
75.0000 mg | ORAL_CAPSULE | Freq: Once | ORAL | Status: AC
Start: 2017-12-15 — End: 2017-12-15
  Administered 2017-12-15: 75 mg via ORAL
  Filled 2017-12-15: qty 1

## 2017-12-15 MED ORDER — ENOXAPARIN SODIUM 40 MG/0.4ML ~~LOC~~ SOLN
40.0000 mg | SUBCUTANEOUS | Status: DC
  Administered 2017-12-15 – 2017-12-17 (×3): 40 mg via SUBCUTANEOUS
  Filled 2017-12-15 (×3): qty 0.4

## 2017-12-15 MED ORDER — METOPROLOL TARTRATE 25 MG PO TABS
12.5000 mg | ORAL_TABLET | Freq: Every day | ORAL | Status: DC
  Administered 2017-12-16 – 2017-12-18 (×2): 12.5 mg via ORAL
  Filled 2017-12-15 (×3): qty 1

## 2017-12-15 MED ORDER — PHENYLEPH-SHARK LIV OIL-MO-PET 0.25-3-14-71.9 % RE OINT
1.0000 "application " | TOPICAL_OINTMENT | Freq: Two times a day (BID) | RECTAL | Status: DC | PRN
  Filled 2017-12-15: qty 28

## 2017-12-15 MED ORDER — SERTRALINE HCL 50 MG PO TABS
50.0000 mg | ORAL_TABLET | Freq: Every day | ORAL | Status: DC
  Administered 2017-12-15 – 2017-12-17 (×3): 50 mg via ORAL
  Filled 2017-12-15 (×3): qty 1

## 2017-12-15 MED ORDER — CEFTRIAXONE SODIUM 1 G IJ SOLR
1.0000 g | Freq: Once | INTRAMUSCULAR | Status: AC
  Administered 2017-12-15: 1 g via INTRAVENOUS
  Filled 2017-12-15: qty 10

## 2017-12-15 NOTE — Progress Notes (Signed)
PHARMACY NOTE -  ANTIBIOTIC RENAL DOSE ADJUSTMENT    Pharmacy to assist with antibiotic renal dose adjustment.   Patient has been initiated on Oseltamivir 75mg  twice daily for 5 days.  SCr 1.71, estimated CrCl 25.3 ml/min  Current dosage is not appropriate based on current CrCl of 25.663ml/min.   Pharmacy has adjusted the dose to Oseltamivir 30mg  every 24 hours. If patient's renal function improves, dose will be changed accordingly.   Gardner CandleSheema M Marco Adelson, PharmD, BCPS Clinical Pharmacist 12/15/2017 1:46 PM

## 2017-12-15 NOTE — ED Provider Notes (Signed)
[facility] Emergency Department Provider Note  ____________________________________________  Time seen: Approximately 10:16 AM  I have reviewed the triage vital signs and the nursing notes.   HISTORY  Chief Complaint Altered Mental Status  Level 5 Caveat: Portions of the History and Physical are unable to be obtained due to patient being a poor historian   HPI Taylor Mack is a 84 y.o. female [facility] due to back pain. No falls, no reported fevers. Patient reports pain in the upper and lower back. Unable to elicit any further details or other symptoms. Denies chest pain, denies shortness of breath, denies vomiting.     Past Medical History:  Diagnosis Date  . Alzheimer's dementia   . Pancreatitis   . Stroke (HCC)   . Vascular dementia      Patient Active Problem List   Diagnosis Date Noted  . History of open reduction and internal fixation (ORIF) procedure 11/19/2016  . Elevated blood pressure reading without diagnosis of hypertension 11/19/2016  . Acute renal insufficiency 11/19/2016  . Hypokalemia 11/19/2016  . Anemia 11/19/2016  . MRSA carrier 11/19/2016  . Closed left hip fracture (HCC) 11/17/2016  . Diverticulitis of large intestine with abscess without bleeding 09/22/2016  . Pancreatitis, acute 12/02/2014  . Dementia 12/02/2014  . History of stroke 12/02/2014  . Acute pancreatitis 12/02/2014     Past Surgical History:  Procedure Laterality Date  . ABDOMINAL HYSTERECTOMY    . APPENDECTOMY    . CHOLECYSTECTOMY    . INTRAMEDULLARY (IM) NAIL INTERTROCHANTERIC Left 11/18/2016   Procedure: INTRAMEDULLARY (IM) NAIL INTERTROCHANTRIC;  Surgeon: Christena FlakeJohn J Poggi, MD;  Location: [facility] ORS;  Service: Orthopedics;  Laterality: Left;     Prior to Admission medications   Medication Sig Start Date End Date Taking? Authorizing Provider  Acetaminophen (ACETAMINOPHEN EXTRA STRENGTH) 167 MG/5ML LIQD Take 15 mLs by  mouth 3 (three) times daily.   Yes [provider]  atorvastatin (LIPITOR) 20 MG tablet Take 20 mg by mouth daily.   Yes [provider]  calcium-vitamin D (OSCAL WITH D) 500-200 MG-UNIT per tablet Take 1 tablet by mouth daily.    Yes [provider]  clopidogrel (PLAVIX) 75 MG tablet Take 75 mg by mouth daily.   Yes [provider]  divalproex (DEPAKOTE SPRINKLE) 125 MG capsule Take 250 mg by mouth 3 (three) times daily.    Yes [provider]  donepezil (ARICEPT) 10 MG tablet Take 10 mg by mouth at bedtime.   Yes [provider]  Melatonin 5 MG TABS Take 1 tablet by mouth at bedtime.   Yes [provider]  metoprolol tartrate (LOPRESSOR) 25 MG tablet Take 12.5 mg by mouth daily.   Yes [provider]  pantoprazole (PROTONIX) 40 MG tablet Take 40 mg by mouth daily.   Yes [provider]  phenylephrine-shark liver oil-mineral oil-petrolatum (PREPARATION H) 0.25-3-14-71.9 % rectal ointment Place 1 application rectally 2 (two) times daily as needed for hemorrhoids.   Yes [provider]  polyethylene glycol (MIRALAX / GLYCOLAX) packet Take 17 g by mouth daily.   Yes [provider]  QUEtiapine (SEROQUEL) 25 MG tablet Take 1 tablet (25 mg total) by mouth at bedtime. Patient taking differently: Take 25 mg by mouth daily.  09/13/15  Yes Darien RamusKaminski, David W, MD  sennosides-docusate sodium (SENOKOT-S) 8.6-50 MG tablet Take 2 tablets by mouth daily.   Yes [provider]  sertraline (ZOLOFT) 50 MG tablet  Take 50 mg by mouth at bedtime.   Yes [provider]  traZODone (DESYREL) 50 MG tablet Take 50 mg by mouth at bedtime.  10/18/16  Yes [provider]  Vitamin D, Ergocalciferol, (DRISDOL) 50000 UNITS CAPS capsule Take 50,000 Units by mouth every 30 (thirty) days.    Yes [provider]  Chlorhexidine Gluconate Cloth 2 % PADS Apply 6 each topically daily at 6 (six) AM. Patient  not taking: Reported on 09/06/2017 11/19/16   [name], MD  enoxaparin (LOVENOX) 40 MG/0.4ML injection Inject 0.4 mLs (40 mg total) into the skin daily. Patient not taking: Reported on 09/06/2017 11/19/16   [name], MD  Lorazepam POWD Apply 1 application topically every 8 (eight) hours as needed (AGITATION). (LORAZEPAM 0.5MG /ML GEL)    [provider]  mupirocin ointment (BACTROBAN) 2 % Place 1 application into the nose 2 (two) times daily. Patient not taking: Reported on 09/06/2017 11/19/16   [name], MD  oxyCODONE (OXY IR/ROXICODONE) 5 MG immediate release tablet Take 1-2 tablets (5-10 mg total) by mouth every 3 (three) hours as needed for breakthrough pain. Patient not taking: Reported on 09/06/2017 11/19/16   [name], MD     Allergies Codeine and Latex   Family History  Family history unknown: Yes    Social History Social History   Tobacco Use  . Smoking status: Never Smoker  . Smokeless tobacco: Never Used  Substance Use Topics  . Alcohol use: No    Alcohol/week: 0.0 oz  . Drug use: No    Review of Systems Unable to reliably obtained due to patient being a poor historian and chronic dementia  ____________________________________________   PHYSICAL EXAM:  VITAL SIGNS: ED Triage Vitals  Enc Vitals Group     BP 12/15/17 0939 103/79     Pulse Rate 12/15/17 0939 (!) 58     Resp 12/15/17 0939 18     Temp 12/15/17 0939 97.6 F (36.4 C)     Temp Source 12/15/17 0939 Oral     SpO2 12/15/17 0939 97 %     Weight 12/15/17 0937 160 lb (72.6 kg)     Height 12/15/17 0937 5\' 5"  (1.651 m)     Head Circumference --      Peak Flow --      Pain Score --      Pain Loc --      Pain Edu? --      Excl. in GC? --     Vital signs reviewed, nursing assessments reviewed.   Constitutional:   Alert and oriented to self. Not in distress. Eyes:   No scleral icterus.  EOMI. No nystagmus. No conjunctival pallor. PERRL. ENT   Head:   Normocephalic and  atraumatic.   Nose:   No congestion/rhinnorhea.    Mouth/Throat:   Dry mucous membranes, no pharyngeal erythema. No peritonsillar mass.    Neck:   No meningismus. Full ROM. Hematological/Lymphatic/Immunilogical:   No cervical lymphadenopathy. Cardiovascular:   RRR. Symmetric bilateral radial and DP pulses.  No murmurs.  Respiratory:   Normal respiratory effort without tachypnea/retractions. Breath sounds are clear and equal bilaterally. No wheezes/rales/rhonchi. Gastrointestinal:   Soft with generalized abdominal tenderness, worse in the upper abdomen. Non distended. There is no CVA tenderness.  No rebound, rigidity, or guarding. Genitourinary:   deferred Musculoskeletal:   Normal range of motion in all extremities. No joint effusions.  No lower extremity tenderness.  No edema. Neurologic:   Normal speech and language.  Motor grossly intact. No acute focal neurologic deficits are appreciated.  Skin:    Skin is warm, dry and intact. No rash noted.  No petechiae, purpura, or bullae.  ____________________________________________    LABS (pertinent positives/negatives) (all labs ordered are listed, but only abnormal results are displayed) Labs Reviewed  COMPREHENSIVE METABOLIC PANEL - Abnormal; Notable for the following components:      Result Value   Glucose, Bld 123 (*)    BUN 25 (*)    Creatinine, Ser 1.71 (*)    AST 55 (*)    GFR calc non Af Amer 27 (*)    GFR calc Af Amer 31 (*)    All other components within normal limits  CBC WITH DIFFERENTIAL/PLATELET - Abnormal; Notable for the following components:   Platelets 129 (*)    All other components within normal limits  INFLUENZA PANEL BY PCR (TYPE A & B) - Abnormal; Notable for the following components:   Influenza A By PCR POSITIVE (*)    All other components within normal limits  URINALYSIS, COMPLETE (UACMP) WITH MICROSCOPIC - Abnormal; Notable for the following components:   Color, Urine AMBER (*)    APPearance  CLOUDY (*)    Hgb urine dipstick MODERATE (*)    Protein, ur 30 (*)    Leukocytes, UA SMALL (*)    Bacteria, UA MANY (*)    Squamous Epithelial / LPF 0-5 (*)    All other components within normal limits  GASTROINTESTINAL PANEL BY PCR, STOOL (REPLACES STOOL CULTURE)  C DIFFICILE QUICK SCREEN W PCR REFLEX  URINE CULTURE  LIPASE, BLOOD   ____________________________________________   EKG    ____________________________________________    RADIOLOGY  Ct Abdomen Pelvis Wo Contrast  Result Date: 12/15/2017 CLINICAL DATA:  Patient positive for influenza. History of dementia. Abdominal pain. Post hysterectomy. EXAM: CT ABDOMEN AND PELVIS WITHOUT CONTRAST TECHNIQUE: Multidetector CT imaging of the abdomen and pelvis was performed following the standard protocol without IV contrast. COMPARISON:  CT abdomen 11/24/2015 FINDINGS: Lower chest: Lung bases are clear. Hepatobiliary: No focal hepatic lesion. Postcholecystectomy. No biliary dilatation. Pancreas: Pancreas is normal. No ductal dilatation. No pancreatic inflammation. Spleen: Normal spleen Adrenals/urinary tract: Adrenal glands and kidneys are normal. The ureters and bladder normal. Stomach/Bowel: Stomach, small bowel, appendix, and cecum are normal. Diverticula sigmoid colon. No acute inflammation rectum normal Vascular/Lymphatic: Abdominal aorta is normal caliber with atherosclerotic calcification. There is no retroperitoneal or periportal lymphadenopathy. No pelvic lymphadenopathy. Reproductive: Post hysterectomy Other: No free fluid. Musculoskeletal: LEFT hip fixation. IMPRESSION: 1. No acute findings in the abdomen pelvis. 2. Sigmoid diverticulosis without evidence diverticulitis. No evidence of bowel inflammation. 3. Anterior wedge compression L1 with approximately 50% loss vertebral body height anteriorly. Fracture does not appear acute but is new from 09/22/2016. 4.  Aortic Atherosclerosis (ICD10-I70.0). Electronically Signed   By:  Genevive Bi M.D.   On: 12/15/2017 12:53    ____________________________________________   PROCEDURES Procedures  ____________________________________________    CLINICAL IMPRESSION / ASSESSMENT AND PLAN / ED COURSE  Pertinent labs & imaging results that were available during my care of the patient were reviewed by me and considered in my medical decision making (see chart for details).     Clinical Course as of Dec 15 1304  Thu Dec 15, 2017  0941 The patient presents with generalized abdominal pain and low back pain. Abdomen is nonacute but she does have diffuse tenderness. Differential includes influenza, diverticulitis, colitis, bowel perforation or obstruction. Plan for labs, CT abdomen  and pelvis to further evaluate. Vital signs are unremarkable and patient is not septic.  [PS]  1145 Labs reveal AKI in setting of UTI and influenza positive. With age and comorbidities, will need to start tamiflu, ceftriaxone, hospitalize for IVF. With normal vitals, patient is not septic.  [PS]  1241 D/w hospitalist.   [PS]  1306 CT abdomen and pelvis overall unremarkable, reveals diverticulosis without diverticulitis. No change in plan, further care by hospitalist., CT ABDOMEN PELVIS WO CONTRAST [PS]    Clinical Course User Index [PS] Sharman Cheek, MD     ____________________________________________   FINAL CLINICAL IMPRESSION(S) / ED DIAGNOSES    Final diagnoses:  Influenza A  Cystitis  AKI (acute kidney injury) (HCC)  Alzheimer's disease of other onset with behavioral disturbance     ED Discharge Orders    None      Portions of this note were generated with dragon dictation software. Dictation errors may occur despite best attempts at proofreading.    Sharman Cheek, MD 12/15/17 [phone]

## 2017-12-15 NOTE — Consult Note (Signed)
Pharmacy Antibiotic Note  Taylor Mack is a 84 y.o. female admitted on 12/15/2017 with UTI.  Pharmacy has been consulted for Zosyn dosing. Patient has had UCx in past with ESBL E. Coli sensitive to Zosyn.   Plan: Start Zosyn 3.375 IV EI every 8 hours.   Height: 5\' 5"  (165.1 cm) Weight: 160 lb (72.6 kg) IBW/kg (Calculated) : 57  Temp (24hrs), Avg:97.6 F (36.4 C), Min:97.6 F (36.4 C), Max:97.6 F (36.4 C)  Recent Labs  Lab 12/15/17 1014  WBC 4.2  CREATININE 1.71*    Estimated Creatinine Clearance: 25.3 mL/min (A) (by C-G formula based on SCr of 1.71 mg/dL (H)).    Allergies  Allergen Reactions  . Codeine Nausea And Vomiting  . Latex Other (See Comments)    unknown    Antimicrobials this admission: 3/7 Zosyn  >>   Dose adjustments this admission:  Microbiology results: 3/7 UCx: pending    Thank you for allowing pharmacy to be a part of this patient's care.  Gardner CandleSheema M Laurell Coalson, PharmD, BCPS Clinical Pharmacist 12/15/2017 1:18 PM

## 2017-12-15 NOTE — ED Triage Notes (Signed)
Pt presents to ED via [facility] from [facility] c/o AMS x3 days. Hx dementia. Pt reports back pain and headache.

## 2017-12-15 NOTE — ED Notes (Signed)
Patient transported to CT 

## 2017-12-15 NOTE — H&P (Signed)
[facility] [place] [facility]   PATIENT NAME: Taylor Mack    MR#:  [id number]  DATE OF BIRTH:  1934/07/25  DATE OF ADMISSION:  12/15/2017  PRIMARY CARE PHYSICIAN: System, Pcp Not In   REQUESTING/REFERRING PHYSICIAN: Dr. Scotty Court   CHIEF COMPLAINT: Abdominal pain   Chief Complaint  Patient presents with  . Altered Mental Status    HISTORY OF PRESENT ILLNESS:  Taylor Mack  is a 84 y.o. female with h/ of Alzheimer's dementia comes from memory care unit due to abdominal pain, diarrhea.  Patient found to have flu, acute kidney injury, UTI.  Unable to get any history from the patient. PAST MEDICAL HISTORY:   Past Medical History:  Diagnosis Date  . Alzheimer's dementia   . Pancreatitis   . Stroke (HCC)   . Vascular dementia     PAST SURGICAL HISTOIRY:   Past Surgical History:  Procedure Laterality Date  . ABDOMINAL HYSTERECTOMY    . APPENDECTOMY    . CHOLECYSTECTOMY    . INTRAMEDULLARY (IM) NAIL INTERTROCHANTERIC Left 11/18/2016   Procedure: INTRAMEDULLARY (IM) NAIL INTERTROCHANTRIC;  Surgeon: Christena Flake, MD;  Location: [facility] ORS;  Service: Orthopedics;  Laterality: Left;    SOCIAL HISTORY:   Social History   Tobacco Use  . Smoking status: Never Smoker  . Smokeless tobacco: Never Used  Substance Use Topics  . Alcohol use: No    Alcohol/week: 0.0 oz    FAMILY HISTORY:   Family History  Family history unknown: Yes    DRUG ALLERGIES:   Allergies  Allergen Reactions  . Codeine Nausea And Vomiting  . Latex Other (See Comments)    unknown    REVIEW OF SYSTEMS:  Unable to obtain ROS due to dementia  MEDICATIONS AT HOME:   Prior to Admission medications   Medication Sig Start Date End Date Taking? Authorizing Provider  Acetaminophen (ACETAMINOPHEN EXTRA STRENGTH) 167 MG/5ML LIQD Take 15 mLs by mouth 3 (three) times daily.   Yes [provider]  atorvastatin (LIPITOR) 20 MG tablet Take 20 mg by mouth daily.   Yes  [provider]  calcium-vitamin D (OSCAL WITH D) 500-200 MG-UNIT per tablet Take 1 tablet by mouth daily.    Yes [provider]  clopidogrel (PLAVIX) 75 MG tablet Take 75 mg by mouth daily.   Yes [provider]  divalproex (DEPAKOTE SPRINKLE) 125 MG capsule Take 250 mg by mouth 3 (three) times daily.    Yes [provider]  donepezil (ARICEPT) 10 MG tablet Take 10 mg by mouth at bedtime.   Yes [provider]  Melatonin 5 MG TABS Take 1 tablet by mouth at bedtime.   Yes [provider]  metoprolol tartrate (LOPRESSOR) 25 MG tablet Take 12.5 mg by mouth daily.   Yes [provider]  pantoprazole (PROTONIX) 40 MG tablet Take 40 mg by mouth daily.   Yes [provider]  phenylephrine-shark liver oil-mineral oil-petrolatum (PREPARATION H) 0.25-3-14-71.9 % rectal ointment Place 1 application rectally 2 (two) times daily as needed for hemorrhoids.   Yes [provider]  polyethylene glycol (MIRALAX / GLYCOLAX) packet Take 17 g by mouth daily.   Yes [provider]  QUEtiapine (SEROQUEL) 25 MG tablet Take 1 tablet (25 mg total) by mouth at bedtime. Patient taking differently: Take 25 mg by mouth daily.  09/13/15  Yes [name], MD  sennosides-docusate sodium (SENOKOT-S) 8.6-50 MG tablet Take 2 tablets by mouth daily.  Yes [provider]  sertraline (ZOLOFT) 50 MG tablet Take 50 mg by mouth at bedtime.   Yes [provider]  traZODone (DESYREL) 50 MG tablet Take 50 mg by mouth at bedtime.  10/18/16  Yes [provider]  Vitamin D, Ergocalciferol, (DRISDOL) 50000 UNITS CAPS capsule Take 50,000 Units by mouth every 30 (thirty) days.    Yes [provider]  Chlorhexidine Gluconate Cloth 2 % PADS Apply 6 each topically daily at 6 (six) AM. Patient not taking: Reported on 09/06/2017 11/19/16   [name], MD  enoxaparin (LOVENOX) 40 MG/0.4ML injection Inject 0.4 mLs (40 mg  total) into the skin daily. Patient not taking: Reported on 09/06/2017 11/19/16   [name], MD  Lorazepam POWD Apply 1 application topically every 8 (eight) hours as needed (AGITATION). (LORAZEPAM 0.5MG /ML GEL)    [provider]  mupirocin ointment (BACTROBAN) 2 % Place 1 application into the nose 2 (two) times daily. Patient not taking: Reported on 09/06/2017 11/19/16   [name], MD  oxyCODONE (OXY IR/ROXICODONE) 5 MG immediate release tablet Take 1-2 tablets (5-10 mg total) by mouth every 3 (three) hours as needed for breakthrough pain. Patient not taking: Reported on 09/06/2017 11/19/16   [name], MD      VITAL SIGNS:  Blood pressure (!) 132/48, pulse 62, temperature 99.2 F (37.3 C), resp. rate 16, height 5\' 5"  (1.651 m), weight 72.6 kg (160 lb), SpO2 95 %.  PHYSICAL EXAMINATION:  GENERAL:  84 y.o.-year-old patient lying in the bed with no acute distress.  EYES: Pupils equal, round, reactive to light   No scleral icterus. Extraocular muscles intact.  HEENT: Head atraumatic, normocephalic. Oropharynx and nasopharynx clear.  NECK:  Supple, no jugular venous distention. No thyroid enlargement, no tenderness.  LUNGS: Normal breath sounds bilaterally, no wheezing, rales,rhonchi or crepitation. No use of accessory muscles of respiration.  CARDIOVASCULAR: S1, S2 normal. No murmurs, rubs, or gallops.  ABDOMEN: Soft, nontender, nondistended. Bowel sounds present. No organomegaly or mass.  EXTREMITIES: No pedal edema, cyanosis, or clubbing.  NEUROLOGIC: unable to obtain neurologic exam because of dementia.  PSYCHIATRIC: The patient is alert and oriented x 3.  SKIN: No obvious rash, lesion, or ulcer.   LABORATORY PANEL:   CBC Recent Labs  Lab 12/15/17 1014  WBC 4.2  HGB 14.0  HCT 42.5  PLT 129*   ------------------------------------------------------------------------------------------------------------------  Chemistries  Recent Labs  Lab 12/15/17 1014  NA  139  K 4.1  CL 103  CO2 25  GLUCOSE 123*  BUN 25*  CREATININE 1.71*  CALCIUM 9.3  AST 55*  ALT 31  ALKPHOS 70  BILITOT 0.9   ------------------------------------------------------------------------------------------------------------------  Cardiac Enzymes No results for input(s): TROPONINI in the last 168 hours. ------------------------------------------------------------------------------------------------------------------  RADIOLOGY:  Ct Abdomen Pelvis Wo Contrast  Result Date: 12/15/2017 CLINICAL DATA:  Patient positive for influenza. History of dementia. Abdominal pain. Post hysterectomy. EXAM: CT ABDOMEN AND PELVIS WITHOUT CONTRAST TECHNIQUE: Multidetector CT imaging of the abdomen and pelvis was performed following the standard protocol without IV contrast. COMPARISON:  CT abdomen 11/24/2015 FINDINGS: Lower chest: Lung bases are clear. Hepatobiliary: No focal hepatic lesion. Postcholecystectomy. No biliary dilatation. Pancreas: Pancreas is normal. No ductal dilatation. No pancreatic inflammation. Spleen: Normal spleen Adrenals/urinary tract: Adrenal glands and kidneys are normal. The ureters and bladder normal. Stomach/Bowel: Stomach, small bowel, appendix, and cecum are normal. Diverticula sigmoid colon. No acute inflammation rectum normal Vascular/Lymphatic: Abdominal aorta is normal caliber with atherosclerotic calcification. There is no retroperitoneal or  periportal lymphadenopathy. No pelvic lymphadenopathy. Reproductive: Post hysterectomy Other: No free fluid. Musculoskeletal: LEFT hip fixation. IMPRESSION: 1. No acute findings in the abdomen pelvis. 2. Sigmoid diverticulosis without evidence diverticulitis. No evidence of bowel inflammation. 3. Anterior wedge compression L1 with approximately 50% loss vertebral body height anteriorly. Fracture does not appear acute but is new from 09/22/2016. 4.  Aortic Atherosclerosis (ICD10-I70.0). Electronically Signed   By: Genevive BiStewart  Edmunds  M.D.   On: 12/15/2017 12:53    EKG:   Orders placed or performed during the hospital encounter of 06/06/17  . ED EKG  . ED EKG    IMPRESSION AND PLAN:  84 year old female patient from [facility] brought in because of abdominal pain, back pain ongoing to have UTI, acute renal failure, likely influenza. #801.84 year old female with Alzheimer's dementia found to have abdominal pain, back pain due to UTI: Patient had history of multidrug-resistant UTI before so I am giving her zosyn, urine cultures. 2.  Acute renal failure due to dehydration: No IV fluids, avoid nephrotoxic agents.  Baseline creatinine 0.85 now it is 1.71. 3.  Type influenza: Order Tamiflu. 4.  Severe Alzheimer's dementia, patient is from memory care unit continue fall precautions, aspiration precautions  need to  discuss with family about CODE STATUS  am all the records are reviewed and case discussed with ED provider. Management plans discussed with the patient, family and they are in agreement.  CODE STATUS: full  TOTAL TIME TAKING CARE OF THIS PATIENT: 55minutes.    Katha HammingSnehalatha Georgi Tuel M.D on 12/15/2017 at 4:28 PM  Between 7am to 6pm - Pager - [phone]  After 6pm go to [email or web address] - password [email or web address]  [facility]  Office  [phone]  CC: Primary care physician; System, Pcp Not In  Note: This dictation was prepared with Dragon dictation along with smaller phrase technology. Any transcriptional errors that result from this process are unintentional.

## 2017-12-16 LAB — CBC
HCT: 35.3 % (ref 35.0–47.0)
Hemoglobin: 11.6 g/dL — ABNORMAL LOW (ref 12.0–16.0)
MCH: 29.4 pg (ref 26.0–34.0)
MCHC: 32.8 g/dL (ref 32.0–36.0)
MCV: 89.6 fL (ref 80.0–100.0)
PLATELETS: 112 10*3/uL — AB (ref 150–440)
RBC: 3.94 MIL/uL (ref 3.80–5.20)
RDW: 14.4 % (ref 11.5–14.5)
WBC: 3.4 10*3/uL — AB (ref 3.6–11.0)

## 2017-12-16 LAB — BASIC METABOLIC PANEL
Anion gap: 9 (ref 5–15)
BUN: 27 mg/dL — ABNORMAL HIGH (ref 6–20)
CHLORIDE: 107 mmol/L (ref 101–111)
CO2: 22 mmol/L (ref 22–32)
CREATININE: 1.28 mg/dL — AB (ref 0.44–1.00)
Calcium: 8.1 mg/dL — ABNORMAL LOW (ref 8.9–10.3)
GFR calc non Af Amer: 38 mL/min — ABNORMAL LOW (ref >60)
GFR, EST AFRICAN AMERICAN: 44 mL/min — AB (ref >60)
Glucose, Bld: 85 mg/dL (ref 65–99)
POTASSIUM: 3.6 mmol/L (ref 3.5–5.1)
SODIUM: 138 mmol/L (ref 135–145)

## 2017-12-16 LAB — GLUCOSE, CAPILLARY: Glucose-Capillary: 85 mg/dL (ref 65–99)

## 2017-12-16 MED ORDER — OSELTAMIVIR PHOSPHATE 30 MG PO CAPS
30.0000 mg | ORAL_CAPSULE | Freq: Two times a day (BID) | ORAL | Status: DC
  Administered 2017-12-16 – 2017-12-18 (×5): 30 mg via ORAL
  Filled 2017-12-16 (×6): qty 1

## 2017-12-16 NOTE — Clinical Social Work Note (Signed)
CSW was able to reach patient's son: Taylor ReasonerCharles Mack: [phone] via phone this afternoon and he stated that he does wish for patient to return [facility] when time and that she has been there about 3 to 4 years. Patient's son will provide transportation back to [facility]. York SpanielMonica Akaiya Touchette MSW,LCSW [phone]

## 2017-12-16 NOTE — Progress Notes (Signed)
[facility] [place] [facility]   PATIENT NAME: Taylor Mack    MR#:  [id number]  DATE OF BIRTH:  1934/08/15  SUBJECTIVE:   Came in with increasing weakness and confusion. Found to have Flu and uti Has dementia and so pleasantly confused REVIEW OF SYSTEMS:   Review of Systems  Unable to perform ROS: Dementia   Tolerating Diet:yesTolerating PT: pending  DRUG ALLERGIES:   Allergies  Allergen Reactions  . Codeine Nausea And Vomiting  . Latex Other (See Comments)    unknown    VITALS:  Blood pressure (!) 105/56, pulse (!) 58, temperature 97.9 F (36.6 C), temperature source Oral, resp. rate 18, height 5\' 5"  (1.651 m), weight 72 kg (158 lb 11.2 oz), SpO2 97 %.  PHYSICAL EXAMINATION:   Physical Exam  GENERAL:  84 y.o.-year-old patient lying in the bed with no acute distress.  EYES: Pupils equal, round, reactive to light and accommodation. No scleral icterus. Extraocular muscles intact.  HEENT: Head atraumatic, normocephalic. Oropharynx and nasopharynx clear.  NECK:  Supple, no jugular venous distention. No thyroid enlargement, no tenderness.  LUNGS: Normal breath sounds bilaterally, no wheezing, rales, rhonchi. No use of accessory muscles of respiration.  CARDIOVASCULAR: S1, S2 normal. No murmurs, rubs, or gallops.  ABDOMEN: Soft, nontender, nondistended. Bowel sounds present. No organomegaly or mass.  EXTREMITIES: No cyanosis, clubbing or edema b/l.    NEUROLOGIC: Cranial nerves II through XII are intact. No focal Motor or sensory deficits b/l.   PSYCHIATRIC:  patient is alert and awake.  SKIN: No obvious rash, lesion, or ulcer.   LABORATORY PANEL:  CBC Recent Labs  Lab 12/16/17 0438  WBC 3.4*  HGB 11.6*  HCT 35.3  PLT 112*    Chemistries  Recent Labs  Lab 12/15/17 1014 12/16/17 0438  NA 139 138  K 4.1 3.6  CL 103 107  CO2 25 22  GLUCOSE 123* 85  BUN 25* 27*  CREATININE 1.71* 1.28*  CALCIUM 9.3 8.1*  AST 55*  --   ALT 31   --   ALKPHOS 70  --   BILITOT 0.9  --    Cardiac Enzymes No results for input(s): TROPONINI in the last 168 hours. RADIOLOGY:  Ct Abdomen Pelvis Wo Contrast  Result Date: 12/15/2017 CLINICAL DATA:  Patient positive for influenza. History of dementia. Abdominal pain. Post hysterectomy. EXAM: CT ABDOMEN AND PELVIS WITHOUT CONTRAST TECHNIQUE: Multidetector CT imaging of the abdomen and pelvis was performed following the standard protocol without IV contrast. COMPARISON:  CT abdomen 11/24/2015 FINDINGS: Lower chest: Lung bases are clear. Hepatobiliary: No focal hepatic lesion. Postcholecystectomy. No biliary dilatation. Pancreas: Pancreas is normal. No ductal dilatation. No pancreatic inflammation. Spleen: Normal spleen Adrenals/urinary tract: Adrenal glands and kidneys are normal. The ureters and bladder normal. Stomach/Bowel: Stomach, small bowel, appendix, and cecum are normal. Diverticula sigmoid colon. No acute inflammation rectum normal Vascular/Lymphatic: Abdominal aorta is normal caliber with atherosclerotic calcification. There is no retroperitoneal or periportal lymphadenopathy. No pelvic lymphadenopathy. Reproductive: Post hysterectomy Other: No free fluid. Musculoskeletal: LEFT hip fixation. IMPRESSION: 1. No acute findings in the abdomen pelvis. 2. Sigmoid diverticulosis without evidence diverticulitis. No evidence of bowel inflammation. 3. Anterior wedge compression L1 with approximately 50% loss vertebral body height anteriorly. Fracture does not appear acute but is new from 09/22/2016. 4.  Aortic Atherosclerosis (ICD10-I70.0). Electronically Signed   By: Genevive BiStewart  Edmunds M.D.   On: 12/15/2017 12:53   ASSESSMENT AND PLAN:  Taylor Mack  is a 84  y.o. female with h/ of Alzheimer's dementia comes from memory care unit due to abdominal pain, diarrhea.  Patient found to have flu, acute kidney injury, UTI.  1. E coli UTI: Patient had history of multidrug-resistant UTI before so will cont  Her on  zosyn - urine cultures pending  2.  Acute renal failure due to dehydration: No IV fluids, avoid nephrotoxic agents.  Baseline creatinine 0.85 now it is 1.71.  3.  Type A influenza: cont Tamiflu.  4.  Severe Alzheimer's dementia, patient is from memory care unit continue fall precautions, aspiration precautions    Case discussed with Care Management/Social Worker. Management plans discussed with the patient, family and they are in agreement.  CODE STATUS: full  DVT Prophylaxis: lovenox  TOTAL TIME TAKING CARE OF THIS PATIENT: 30 minutes.  >50% time spent on counselling and coordination of care  POSSIBLE D/C IN *1-2DAYS, DEPENDING ON CLINICAL CONDITION.  Note: This dictation was prepared with Dragon dictation along with smaller phrase technology. Any transcriptional errors that result from this process are unintentional.  Enedina Finner M.D on 12/16/2017 at 2:41 PM  Between 7am to 6pm - Pager - [phone]  After 6pm go to [email or web address] - password [email or web address]  [facility]  Office  [phone]  CC: Primary care physician; System, Pcp Not InPatient ID: Taylor Mack, female   DOB: [birth year], 84 y.o.   MRN: [id number]

## 2017-12-16 NOTE — Progress Notes (Signed)
PHARMACY NOTE -  ANTIBIOTIC RENAL DOSE ADJUSTMENT    Pharmacy to assist with antibiotic renal dose adjustment.   Patient has been initiated on Oseltamivir 75mg  twice daily for 5 days.  SCr 1.71, estimated CrCl 33.7 ml/min  Current dosage is not appropriate based on current CrCl of 33.597ml/min.   Pharmacy has adjusted the dose to Oseltamivir 30mg  every 12 hours. If patient's renal function improves, dose will be changed accordingly.   Luan PullingGarrett Maryetta Shafer, PharmD, BCGP Clinical Pharmacist 12/16/2017 10:33 AM

## 2017-12-16 NOTE — Care Management Important Message (Signed)
Important Message  Patient Details  Name: Ma RingsBessie L Lau MRN: [id number] Date of Birth: September 21, 1934   Medicare Important Message Given:  Yes    Chapman FitchBOWEN, Terris Bodin T, RN 12/16/2017, 2:54 PM

## 2017-12-16 NOTE — Clinical Social Work Note (Signed)
Clinical Social Work Assessment  Patient Details  Name: Taylor Mack MRN: [id number] Date of Birth: [birth year]  Date of referral:  12/16/17               Reason for consult:  Discharge Planning                Permission sought to share information with:    Permission granted to share information::     Name::        Agency::     Relationship::     Contact Information:     Housing/Transportation Living arrangements for the past 2 months:    Source of Information:  Facility Patient Interpreter Needed:  None Criminal Activity/Legal Involvement Pertinent to Current Situation/Hospitalization:  No - Comment as needed Significant Relationships:  Adult Children Lives with:  Facility Resident Do you feel safe going back to the place where you live?    Need for family participation in patient care:     Care giving concerns:  [facility].    Social Worker assessment / plan:  CSW spoke with Tiffany at [facility] and she stated patient's baseline is oriented to self only. She typically is encouraged to stay in a wheelchair because she does not ambulate well and requires assistance with ADL's. Tiffany stated if patient is ready for discharge over the weekend, to contact weekend staff and [facility] and they will send it to her for review. CSW will contact patient's son.  Employment status:  Retired Health and safety inspectornsurance information:  Medicare PT Recommendations:    Information / Referral to community resources:     Patient/Family's Response to care:  CSW to call son.  Patient/Family's Understanding of and Emotional Response to Diagnosis, Current Treatment, and Prognosis:  CSW to call son.  Emotional Assessment Appearance:    Attitude/Demeanor/Rapport:    Affect (typically observed):    Orientation:  Oriented to Self Alcohol / Substance use:  Not Applicable Psych involvement (Current and /or in the community):     Discharge Needs  Concerns to be  addressed:    Readmission within the last 30 days:  No Current discharge risk:  None Barriers to Discharge:  No Barriers Identified   York SpanielMonica Sherrol Vicars, LCSW 12/16/2017, 12:19 PM

## 2017-12-16 NOTE — Evaluation (Signed)
Clinical/Bedside Swallow Evaluation Patient Details  Name: Taylor Mack MRN: [id number] Date of Birth: Aug 08, 1934  Today's Date: 12/16/2017 Time: SLP Start Time (ACUTE ONLY): 0930 SLP Stop Time (ACUTE ONLY): 1030 SLP Time Calculation (min) (ACUTE ONLY): 60 min  Past Medical History:  Past Medical History:  Diagnosis Date  . Alzheimer's dementia   . Pancreatitis   . Stroke (HCC)   . Vascular dementia    Past Surgical History:  Past Surgical History:  Procedure Laterality Date  . ABDOMINAL HYSTERECTOMY    . APPENDECTOMY    . CHOLECYSTECTOMY    . INTRAMEDULLARY (IM) NAIL INTERTROCHANTERIC Left 11/18/2016   Procedure: INTRAMEDULLARY (IM) NAIL INTERTROCHANTRIC;  Surgeon: Christena Flake, MD;  Location: [facility] ORS;  Service: Orthopedics;  Laterality: Left;   HPI:  Pt is a 84 y.o. female with h/ of Alzheimer's Dementia, pacreatitis comes from memory care unit at SNF due to abdominal pain, diarrhea.  Patient found to have flu, acute kidney injury, UTI.  Unable to get any history from the patient d/t her confusion and baseline Dementia.    Assessment / Plan / Recommendation Clinical Impression  Pt appears to present w/ adequate oropharyngeal phase swallow function w/ po trials; she appears at reduced risk for aspiration when following general aspiration precautions. Pt consumed po trials w/ no immediate, overt s/s of aspiration noted; no wet vocal quality or decline in respiratory status during/post trials. Oral phase appeared timely for bolus transfer and adequate for oral clearing; pt required foods to be moistened well and broken down for easier mashing/masticating secondary to missing most dentition. Pt does not wear her upper denture plate. Pt required feeding assistance d/t wearing mitts - Confusion, picking at lines per NSG. Recommend a dysphagia level 3(mech soft) w/ MINCED meats; Thin liquids via cup if coughing noted w/ straw use. Recommend general aspiration precautions; feeding support at  meals; Pills in Puree - Crushed as able/needed d/t Cognitive decline. NSG to reconsult if any decline in status while admitted.  SLP Visit Diagnosis: Dysphagia, oral phase (R13.11)    Aspiration Risk  (reduced)    Diet Recommendation  Dysphagia level 3(mech soft) w/ MINCED meats, Gravy; Thin liquids. Aspiration precautions; feeding support at meals; reduced distractions d/t Dementia baseline.   Medication Administration: Crushed with puree(as able for easier swallowing)    Other  Recommendations Recommended Consults: (Dietician f/u) Oral Care Recommendations: Oral care BID;Staff/trained caregiver to provide oral care   Follow up Recommendations None      Frequency and Duration (n/a)  (n/a)       Prognosis Prognosis for Safe Diet Advancement: Fair(-Good) Barriers to Reach Goals: Cognitive deficits;Severity of deficits(baseline)      Swallow Study   General Date of Onset: 12/15/17 HPI: Pt is a 84 y.o. female with h/ of Alzheimer's Dementia, pacreatitis comes from memory care unit at SNF due to abdominal pain, diarrhea.  Patient found to have flu, acute kidney injury, UTI.  Unable to get any history from the patient d/t her confusion and baseline Dementia.  Type of Study: Bedside Swallow Evaluation Previous Swallow Assessment: none reported Diet Prior to this Study: Regular;Thin liquids Temperature Spikes Noted: Yes(100.9;  wbc 3.4) Respiratory Status: Room air History of Recent Intubation: No Behavior/Cognition: Cooperative;Pleasant mood;Confused;Distractible;Requires cueing(awake; verbally responsive) Oral Cavity Assessment: Dry Oral Care Completed by SLP: Recent completion by staff Oral Cavity - Dentition: Missing dentition Vision: Functional for self-feeding Self-Feeding Abilities: Total assist(confusion; hands in mitts) Patient Positioning: Upright in bed Baseline Vocal Quality: Normal Volitional  Cough: Strong Volitional Swallow: Able to elicit    Oral/Motor/Sensory  Function Overall Oral Motor/Sensory Function: Within functional limits(grossly followed through w/ few commands; w/ boluses)   Ice Chips Ice chips: Within functional limits Presentation: Spoon(fed; 3 trials)   Thin Liquid Thin Liquid: Within functional limits Presentation: Cup;Self Fed;Straw(~4 ozs total)    Nectar Thick Nectar Thick Liquid: Not tested   Honey Thick Honey Thick Liquid: Not tested   Puree Puree: Within functional limits Presentation: Spoon(fed; 6 trials)   Solid   GO   Solid: Impaired(minced, broken down) Presentation: Spoon(fed; 4 trials) Oral Phase Impairments: Impaired mastication(few teeth) Oral Phase Functional Implications: Impaired mastication Pharyngeal Phase Impairments: (none)        Jerilynn SomKatherine Watson, MS, CCC-SLP Watson,Katherine 12/16/2017,3:03 PM

## 2017-12-16 NOTE — Evaluation (Signed)
Physical Therapy Evaluation Patient Details Name: Taylor Mack MRN: [id number] DOB: November 11, 1933 Today's Date: 12/16/2017   History of Present Illness  Pt is an 84 y.o. female presenting to hospital with back pain, abdominal pain, and diarrhea.  Pt admitted with (+) flu, AKI, and UTI.  Imaging showing anterior wedge compression fx L1 (new from 09/22/16 but not acute).  PMH also Alzheimer's dementia, stroke, pancreatitis, 11/18/16 ORIF L hip.  Clinical Impression  Prior to hospital admission, per SW note pt was encouraged to stay in w/c d/t pt does not ambulate well (via information from [facility]).  Currently pt is mod assist with bed mobility; mod assist with transfers; and CGA ambulating a few feet with RW.  Pt would benefit from skilled PT to address noted impairments and functional limitations (see below for any additional details).  Upon hospital discharge, recommend pt discharge back to facility with HHPT and 24/7 assist.    Follow Up Recommendations Home health PT;Supervision/Assistance - 24 hour    Equipment Recommendations  Rolling walker with 5" wheels    Recommendations for Other Services       Precautions / Restrictions Precautions Precautions: Fall Precaution Comments: Aspiration Restrictions Weight Bearing Restrictions: No      Mobility  Bed Mobility Overal bed mobility: Needs Assistance Bed Mobility: Supine to Sit;Sit to Supine     Supine to sit: Mod assist;HOB elevated Sit to supine: Mod assist;HOB elevated   General bed mobility comments: assist for trunk and B LE's; vc's for technique; 2 assist to boost pt up in bed end of session  Transfers Overall transfer level: Needs assistance Equipment used: Rolling walker (2 wheeled);None Transfers: Sit to/from [employer] Sit to Stand: Min assist;Mod assist Stand pivot transfers: Mod assist       General transfer comment: assist to initiate stand from bed and BSC; mod assist stand step  turn bed to [facility] no AD  Ambulation/Gait Ambulation/Gait assistance: Min guard Ambulation Distance (Feet): 3 Feet Assistive device: Rolling walker (2 wheeled)   Gait velocity: decreased   General Gait Details: decreased B step length/foot clearance/heelstrike; steady with RW  Stairs            Wheelchair Mobility    Modified Rankin (Stroke Patients Only)       Balance Overall balance assessment: Needs assistance Sitting-balance support: No upper extremity supported;Feet supported Sitting balance-Leahy Scale: Good Sitting balance - Comments: steady sitting reaching within BOS   Standing balance support: Bilateral upper extremity supported Standing balance-Leahy Scale: Poor Standing balance comment: steady with B UE support on RW                             Pertinent Vitals/Pain Pain Assessment: No/denies pain     Home Living Family/patient expects to be discharged to:: Other (Comment)                 Additional Comments: [facility]    Prior Function Level of Independence: Needs assistance   Gait / Transfers Assistance Needed: Per SW note (via information from [place]) pt encouraged to stay in w/c d/t pt does not ambulate well.           Hand Dominance        Extremity/Trunk Assessment   Upper Extremity Assessment Upper Extremity Assessment: Generalized weakness    Lower Extremity Assessment Lower Extremity Assessment: Generalized weakness    Cervical / Trunk Assessment Cervical / Trunk  Assessment: Kyphotic  Communication   Communication: No difficulties  Cognition Arousal/Alertness: Awake/alert Behavior During Therapy: WFL for tasks assessed/performed Overall Cognitive Status: No family/caregiver present to determine baseline cognitive functioning(Oriented to person only (per chart that is pt's baseline))                                        General Comments General comments (skin integrity,  edema, etc.): Pt resting in bed upon PT arrival.  Nursing cleared pt for participation in physical therapy.  Pt agreeable to PT session.  Nursing tech present during session to assist with toileting and then stayed to assist pt with lunch.    Exercises     Assessment/Plan    PT Assessment Patient needs continued PT services  PT Problem List Decreased strength;Decreased balance;Decreased mobility       PT Treatment Interventions DME instruction;Gait training;Functional mobility training;Therapeutic activities;Therapeutic exercise;Balance training;Patient/family education    PT Goals (Current goals can be found in the Care Plan section)  Acute Rehab PT Goals Patient Stated Goal: to improve mobility PT Goal Formulation: With patient Time For Goal Achievement: 12/30/17 Potential to Achieve Goals: Fair    Frequency Min 2X/week   Barriers to discharge        Co-evaluation               AM-PAC PT "6 Clicks" Daily Activity  Outcome Measure Difficulty turning over in bed (including adjusting bedclothes, sheets and blankets)?: A Little Difficulty moving from lying on back to sitting on the side of the bed? : A Lot Difficulty sitting down on and standing up from a chair with arms (e.g., wheelchair, bedside commode, etc,.)?: Unable Help needed moving to and from a bed to chair (including a wheelchair)?: A Lot Help needed walking in hospital room?: A Lot Help needed climbing 3-5 steps with a railing? : Total 6 Click Score: 11    End of Session Equipment Utilized During Treatment: Gait belt Activity Tolerance: Patient tolerated treatment well Patient left: in bed;with call bell/phone within reach;with bed alarm set;with nursing/sitter in room Nurse Communication: Mobility status;Precautions PT Visit Diagnosis: Unsteadiness on feet (R26.81);Other abnormalities of gait and mobility (R26.89);Muscle weakness (generalized) (M62.81)    Time: [id number] PT Time Calculation (min)  (ACUTE ONLY): 26 min   Charges:   PT Evaluation $PT Eval Low Complexity: 1 Low PT Treatments $Therapeutic Activity: 8-22 mins   PT G CodesHendricks Limes:        Aylla Huffine, PT 12/16/17, 4:04 PM [phone]

## 2017-12-16 NOTE — Progress Notes (Signed)
Chaplain met with patient and discussed family and her formative years. Her memories of her deceased daughter generated tears.  She is not aware of where she lives. She seems to desire company and conversation. Prayer and active listening were offered.

## 2017-12-17 LAB — URINE CULTURE: Culture: >=100000 — AB

## 2017-12-17 LAB — GLUCOSE, CAPILLARY
Glucose-Capillary: 75 mg/dL (ref 65–99)
Glucose-Capillary: 80 mg/dL (ref 65–99)

## 2017-12-17 MED ORDER — SODIUM CHLORIDE 0.9 % IV SOLN
1.0000 g | INTRAVENOUS | Status: AC
  Administered 2017-12-17 – 2017-12-18 (×2): 1 g via INTRAVENOUS
  Filled 2017-12-17 (×2): qty 1

## 2017-12-17 NOTE — Progress Notes (Signed)
[facility] [place] [facility]   PATIENT NAME: Taylor [facility]    MR#:  [id number]  DATE OF BIRTH:  06-Feb-1934  SUBJECTIVE:   Came in with increasing weakness and confusion. Found to have Flu and uti Has dementia and so pleasantly confused Patient is out of the chair eating breakfast REVIEW OF SYSTEMS:   Review of Systems  Unable to perform ROS: Dementia   Tolerating Diet:yesTolerating PT: Home health PT with rolling walker  DRUG ALLERGIES:   Allergies  Allergen Reactions  . Codeine Nausea And Vomiting  . Latex Other (See Comments)    unknown    VITALS:  Blood pressure (!) 114/50, pulse (!) 52, temperature 98.2 F (36.8 C), temperature source Oral, resp. rate 16, height 5\' 5"  (1.651 m), weight 69.1 kg (152 lb 4.8 oz), SpO2 95 %.  PHYSICAL EXAMINATION:   Physical Exam  GENERAL:  84 y.o.-year-old patient lying in the bed with no acute distress.  EYES: Pupils equal, round, reactive to light and accommodation. No scleral icterus. Extraocular muscles intact.  HEENT: Head atraumatic, normocephalic. Oropharynx and nasopharynx clear.  NECK:  Supple, no jugular venous distention. No thyroid enlargement, no tenderness.  LUNGS: Normal breath sounds bilaterally, no wheezing, rales, rhonchi. No use of accessory muscles of respiration.  CARDIOVASCULAR: S1, S2 normal. No murmurs, rubs, or gallops.  ABDOMEN: Soft, nontender, nondistended. Bowel sounds present. No organomegaly or mass.  EXTREMITIES: No cyanosis, clubbing or edema b/l.    NEUROLOGIC: Moves all extremities well.  No focal deficit. PSYCHIATRIC:  patient is alert and awake.  Pleasantly confused secondary to dementia SKIN: No obvious rash, lesion, or ulcer.   LABORATORY PANEL:  CBC Recent Labs  Lab 12/16/17 0438  WBC 3.4*  HGB 11.6*  HCT 35.3  PLT 112*    Chemistries  Recent Labs  Lab 12/15/17 1014 12/16/17 0438  NA 139 138  K 4.1 3.6  CL 103 107  CO2 25 22  GLUCOSE 123* 85  BUN  25* 27*  CREATININE 1.71* 1.28*  CALCIUM 9.3 8.1*  AST 55*  --   ALT 31  --   ALKPHOS 70  --   BILITOT 0.9  --    Cardiac Enzymes No results for input(s): TROPONINI in the last 168 hours. RADIOLOGY:  Ct Abdomen Pelvis Wo Contrast  Result Date: 12/15/2017 CLINICAL DATA:  Patient positive for influenza. History of dementia. Abdominal pain. Post hysterectomy. EXAM: CT ABDOMEN AND PELVIS WITHOUT CONTRAST TECHNIQUE: Multidetector CT imaging of the abdomen and pelvis was performed following the standard protocol without IV contrast. COMPARISON:  CT abdomen 11/24/2015 FINDINGS: Lower chest: Lung bases are clear. Hepatobiliary: No focal hepatic lesion. Postcholecystectomy. No biliary dilatation. Pancreas: Pancreas is normal. No ductal dilatation. No pancreatic inflammation. Spleen: Normal spleen Adrenals/urinary tract: Adrenal glands and kidneys are normal. The ureters and bladder normal. Stomach/Bowel: Stomach, small bowel, appendix, and cecum are normal. Diverticula sigmoid colon. No acute inflammation rectum normal Vascular/Lymphatic: Abdominal aorta is normal caliber with atherosclerotic calcification. There is no retroperitoneal or periportal lymphadenopathy. No pelvic lymphadenopathy. Reproductive: Post hysterectomy Other: No free fluid. Musculoskeletal: LEFT hip fixation. IMPRESSION: 1. No acute findings in the abdomen pelvis. 2. Sigmoid diverticulosis without evidence diverticulitis. No evidence of bowel inflammation. 3. Anterior wedge compression L1 with approximately 50% loss vertebral body height anteriorly. Fracture does not appear acute but is new from 09/22/2016. 4.  Aortic Atherosclerosis (ICD10-I70.0). Electronically Signed   By: Taylor Mack  Mack M.D.   On: 12/15/2017 12:53  ASSESSMENT AND PLAN:  Taylor Mack  is a 84 y.o. female with h/ of Alzheimer's dementia comes from memory care unit due to abdominal pain, diarrhea.  Patient found to have flu, acute kidney injury, UTI.  1. ESBL E coli  UTI: Patient had history of multidrug-resistant UTI  -Patient on Zosyn--- changed to IV ertapenem this was discussed with pharmacist -Patient can get IM ertapenem at [facility] if they are able to give.  2.  Acute renal failure due to dehydration: No IV fluids, avoid nephrotoxic agents.  Baseline creatinine 0.85 now it is 1.71.  3.  Type A influenza: cont Tamiflu.  4.  Severe Alzheimer's dementia, patient is from memory care unit continue fall precautions, aspiration precautions  PT recommends home health PT which will be arranged.  Discussed with patient's son Leandro Reasoner over the phone Patient continues to improve will discharge to [facility] tomorrow  Case discussed with Care Management/Social Worker. Management plans discussed with the patient, family and they are in agreement.  CODE STATUS: full  DVT Prophylaxis: lovenox  TOTAL TIME TAKING CARE OF THIS PATIENT: 30 minutes.  >50% time spent on counselling and coordination of care  POSSIBLE D/C IN *1-2DAYS, DEPENDING ON CLINICAL CONDITION.  Note: This dictation was prepared with Dragon dictation along with smaller phrase technology. Any transcriptional errors that result from this process are unintentional.  Taylor Mack M.D on 12/17/2017 at 10:31 AM  Between 7am to 6pm - Pager - [phone]  After 6pm go to [email or web address] - password [email or web address]  [facility]  Office  [phone]  CC: Primary care physician; System, Pcp Not InPatient ID: Taylor Mack, female   DOB: 01-22-1934, 84 y.o.   MRN: [id number]

## 2017-12-17 NOTE — Clinical Social Work Note (Signed)
CSW spoke with [facility] to confirm that the patient can return tomorrow with 5 more days of IM ertapenem. Tiffany confirmed but did ask that the MD [facility] begin the IM implementation on Monday. The CSW has updated the attending MD. CSW is following to continue discharge facilitation tomorrow.  Argentina PonderKaren Martha Kalayna Noy, MSW, Theresia MajorsLCSWA [phone]

## 2017-12-18 LAB — GLUCOSE, CAPILLARY: Glucose-Capillary: 75 mg/dL (ref 65–99)

## 2017-12-18 MED ORDER — OSELTAMIVIR PHOSPHATE 30 MG PO CAPS: 30.0000 mg | ORAL_CAPSULE | Freq: Two times a day (BID) | ORAL | 0 refills | Status: AC

## 2017-12-18 MED ORDER — ERTAPENEM SODIUM 1 G IJ SOLR: 1.0000 g | INTRAMUSCULAR | Status: DC

## 2017-12-18 MED ORDER — ERTAPENEM SODIUM 1 G IJ SOLR
1.0000 g | INTRAMUSCULAR | 0 refills | Status: AC
Start: 2017-12-19 — End: 2017-12-23

## 2017-12-18 NOTE — Care Management Note (Addendum)
Case Management Note  Patient Details  Name: Taylor RingsBessie L Doucette MRN: [id number] Date of Birth: [birth year]  Subjective/Objective:      Mrs [name] is returning [facility] and an RN [facility] will administer her injections x 5 days per CSW. All DME present at [facility].   A referral for HH=PT, RN was called to Maralyn SagoSarah at [facility]. IM injections to start tomorrow [facility].           Action/Plan:   Expected Discharge Date:  12/18/17               Expected Discharge Plan:     In-House Referral:     Discharge planning Services     Post Acute Care Choice:    Choice offered to:     DME Arranged:    DME Agency:     HH Arranged:    HH Agency:     Status of Service:     If discussed at [employer] Meetings, dates discussed:    Additional Comments:  Syrenity Klepacki A, RN 12/18/2017, 8:59 AM

## 2017-12-18 NOTE — Discharge Summary (Signed)
[facility] [place] [facility]   PATIENT NAME: Taylor Mack    MR#:  [id number]  DATE OF BIRTH:  August 22, 1934  DATE OF ADMISSION:  12/15/2017 ADMITTING PHYSICIAN: Katha Hamming, MD  DATE OF DISCHARGE: 12/18/2017  PRIMARY CARE PHYSICIAN: System, Pcp Not In    ADMISSION DIAGNOSIS:  Influenza A [J10.1] AKI (acute kidney injury) (HCC) [N17.9] Cystitis [N30.90] Alzheimer's disease of other onset with behavioral disturbance [G30.8, F02.81]  DISCHARGE DIAGNOSIS:  ESBL UTI Influenza A Acute renal failure secondary to dehydration improved  SECONDARY DIAGNOSIS:   Past Medical History:  Diagnosis Date  . Alzheimer's dementia   . Pancreatitis   . Stroke (HCC)   . Vascular dementia     HOSPITAL COURSE:   BessieCookis a84 y.o.femalewith h/of Alzheimer's dementia comes from memory care unit due to abdominal pain,diarrhea. Patient found to have flu, acute kidney injury, UTI.  1. ESBL E coli UTI: Patient had history of multidrug-resistant UTI  -Patient on Zosyn--- changed to IV ertapenem this was discussed with pharmacist -Patient can get IM ertapenem (total 7 days) at [facility] arranged  2. Acute renal failure due to dehydration: No IV fluids, avoid nephrotoxic agents. Baseline creatinine 0.85 now it is 1.71--1.28  3. Type A influenza: cont Tamiflu  For 5 days  4. Severe Alzheimer's dementia, patient is from memory care unit continue fall precautions, aspiration precautions  Overall improving.  Patient will discharge to [facility] memory care unit with home health PT and RN.  This was discussed with patient's son Leandro Reasoner. D/c today  CONSULTS OBTAINED:    DRUG ALLERGIES:   Allergies  Allergen Reactions  . Codeine Nausea And Vomiting  . Latex Other (See Comments)    unknown    DISCHARGE MEDICATIONS:   Allergies as of 12/18/2017      Reactions   Codeine Nausea And Vomiting   Latex Other (See Comments)   unknown      Medication List    STOP taking these medications   enoxaparin 40 MG/0.4ML injection Commonly known as:  LOVENOX   oxyCODONE 5 MG immediate release tablet Commonly known as:  Oxy IR/ROXICODONE     TAKE these medications   ACETAMINOPHEN EXTRA STRENGTH 167 MG/5ML Liqd Generic drug:  Acetaminophen Take 15 mLs by mouth 3 (three) times daily.   atorvastatin 20 MG tablet Commonly known as:  LIPITOR Take 20 mg by mouth daily.   calcium-vitamin D 500-200 MG-UNIT tablet Commonly known as:  OSCAL WITH D Take 1 tablet by mouth daily.   Chlorhexidine Gluconate Cloth 2 % Pads Apply 6 each topically daily at 6 (six) AM.   clopidogrel 75 MG tablet Commonly known as:  PLAVIX Take 75 mg by mouth daily.   divalproex 125 MG capsule Commonly known as:  DEPAKOTE SPRINKLE Take 250 mg by mouth 3 (three) times daily.   donepezil 10 MG tablet Commonly known as:  ARICEPT Take 10 mg by mouth at bedtime.   ertapenem 1 g injection Commonly known as:  INVANZ Inject 1 g into the muscle daily for 4 days. Start taking on:  12/19/2017   Lorazepam Powd Apply 1 application topically every 8 (eight) hours as needed (AGITATION). (LORAZEPAM 0.5MG /ML GEL)   Melatonin 5 MG Tabs Take 1 tablet by mouth at bedtime.   metoprolol tartrate 25 MG tablet Commonly known as:  LOPRESSOR Take 12.5 mg by mouth daily.   mupirocin ointment 2 % Commonly known as:  BACTROBAN Place 1 application into the nose 2 (two)  times daily.   oseltamivir 30 MG capsule Commonly known as:  TAMIFLU Take 1 capsule (30 mg total) by mouth 2 (two) times daily for 2 days.   pantoprazole 40 MG tablet Commonly known as:  PROTONIX Take 40 mg by mouth daily.   phenylephrine-shark liver oil-mineral oil-petrolatum 0.25-3-14-71.9 % rectal ointment Commonly known as:  PREPARATION H Place 1 application rectally 2 (two) times daily as needed for hemorrhoids.   polyethylene glycol packet Commonly known as:  MIRALAX /  GLYCOLAX Take 17 g by mouth daily.   QUEtiapine 25 MG tablet Commonly known as:  SEROQUEL Take 1 tablet (25 mg total) by mouth at bedtime. What changed:  when to take this   sennosides-docusate sodium 8.6-50 MG tablet Commonly known as:  SENOKOT-S Take 2 tablets by mouth daily.   sertraline 50 MG tablet Commonly known as:  ZOLOFT Take 50 mg by mouth at bedtime.   traZODone 50 MG tablet Commonly known as:  DESYREL Take 50 mg by mouth at bedtime.   Vitamin D (Ergocalciferol) 50000 units Caps capsule Commonly known as:  DRISDOL Take 50,000 Units by mouth every 30 (thirty) days.            Durable Medical Equipment  (From admission, onward)        Start     Ordered   12/18/17 0814  For home use only DME Walker rolling  Once    Question:  Patient needs a walker to treat with the following condition  Answer:  Weakness generalized   12/18/17 0813      If you experience worsening of your admission symptoms, develop shortness of breath, life threatening emergency, suicidal or homicidal thoughts you must seek medical attention immediately by calling 911 or calling your MD immediately  if symptoms less severe.  You Must read complete instructions/literature along with all the possible adverse reactions/side effects for all the Medicines you take and that have been prescribed to you. Take any new Medicines after you have completely understood and accept all the possible adverse reactions/side effects.   Please note  You were cared for by a hospitalist during your hospital stay. If you have any questions about your discharge medications or the care you received while you were in the hospital after you are discharged, you can call the unit and asked to speak with the hospitalist on call if the hospitalist that took care of you is not available. Once you are discharged, your primary care physician will handle any further medical issues. Please note that NO REFILLS for any discharge  medications will be authorized once you are discharged, as it is imperative that you return to your primary care physician (or establish a relationship with a primary care physician if you do not have one) for your aftercare needs so that they can reassess your need for medications and monitor your lab values. Today   SUBJECTIVE   Doing well no new issues per RN  VITAL SIGNS:  Blood pressure (!) 145/69, pulse (!) 43, temperature 98.4 F (36.9 C), resp. rate 16, height 5\' 5"  (1.651 m), weight 69.1 kg (152 lb 4.8 oz), SpO2 97 %.  I/O:    Intake/Output Summary (Last 24 hours) at 12/18/2017 0816 Last data filed at 12/18/2017 0300 Gross per 24 hour  Intake 1459 ml  Output -  Net 1459 ml    PHYSICAL EXAMINATION:  GENERAL:  84 y.o.-year-old patient lying in the bed with no acute distress.  EYES: Pupils equal, round, reactive  to light and accommodation. No scleral icterus. Extraocular muscles intact.  HEENT: Head atraumatic, normocephalic. Oropharynx and nasopharynx clear.  NECK:  Supple, no jugular venous distention. No thyroid enlargement, no tenderness.  LUNGS: Normal breath sounds bilaterally, no wheezing, rales,rhonchi or crepitation. No use of accessory muscles of respiration.  CARDIOVASCULAR: S1, S2 normal. No murmurs, rubs, or gallops.  ABDOMEN: Soft, non-tender, non-distended. Bowel sounds present. No organomegaly or mass.  EXTREMITIES: No pedal edema, cyanosis, or clubbing.  NEUROLOGIC: Cranial nerves II through XII are intact. Muscle strength 5/5 in all extremities. Sensation intact. Gait not checked.  PSYCHIATRIC: The patient is alert and oriented x 3.  SKIN: No obvious rash, lesion, or ulcer.   DATA REVIEW:   CBC  Recent Labs  Lab 12/16/17 0438  WBC 3.4*  HGB 11.6*  HCT 35.3  PLT 112*    Chemistries  Recent Labs  Lab 12/15/17 1014 12/16/17 0438  NA 139 138  K 4.1 3.6  CL 103 107  CO2 25 22  GLUCOSE 123* 85  BUN 25* 27*  CREATININE 1.71* 1.28*  CALCIUM 9.3  8.1*  AST 55*  --   ALT 31  --   ALKPHOS 70  --   BILITOT 0.9  --     Microbiology Results   Recent Results (from the past 240 hour(s))  Urine culture     Status: Abnormal   Collection Time: 12/15/17 10:14 AM  Result Value Ref Range Status   Specimen Description   Final    URINE, RANDOM Performed at [facility], [address] [place], [place] [address]    Special Requests   Final    NONE Performed at [facility], [address] [place], [place] [address]    Culture (A)  Final    >=100,000 COLONIES/mL ESCHERICHIA COLI Confirmed Extended Spectrum Beta-Lactamase Producer (ESBL).  In bloodstream infections from ESBL organisms, carbapenems are preferred over piperacillin/tazobactam. They are shown to have a lower risk of mortality. Performed at [facility], [address], [place], [place] [address]    Report Status 12/17/2017 FINAL  Final   Organism ID, Bacteria ESCHERICHIA COLI (A)  Final      Susceptibility   Escherichia coli - MIC*    AMPICILLIN >=32 RESISTANT Resistant     CEFAZOLIN >=64 RESISTANT Resistant     CEFTRIAXONE >=64 RESISTANT Resistant     CIPROFLOXACIN >=4 RESISTANT Resistant     GENTAMICIN <=1 SENSITIVE Sensitive     IMIPENEM <=0.25 SENSITIVE Sensitive     NITROFURANTOIN 32 SENSITIVE Sensitive     TRIMETH/SULFA >=320 RESISTANT Resistant     AMPICILLIN/SULBACTAM 4 SENSITIVE Sensitive     PIP/TAZO <=4 SENSITIVE Sensitive     Extended ESBL POSITIVE Resistant     * >=100,000 COLONIES/mL ESCHERICHIA COLI  Gastrointestinal Panel by PCR , Stool     Status: None   Collection Time: 12/15/17 10:14 AM  Result Value Ref Range Status   Campylobacter species NOT DETECTED NOT DETECTED Final   Plesimonas shigelloides NOT DETECTED NOT DETECTED Final   Salmonella species NOT DETECTED NOT DETECTED Final   Yersinia enterocolitica NOT DETECTED NOT DETECTED Final   Vibrio species NOT DETECTED NOT DETECTED Final   Vibrio cholerae NOT DETECTED  NOT DETECTED Final   Enteroaggregative E coli (EAEC) NOT DETECTED NOT DETECTED Final   Enteropathogenic E coli (EPEC) NOT DETECTED NOT DETECTED Final   Enterotoxigenic E coli (ETEC) NOT DETECTED NOT DETECTED Final   Shiga like toxin producing  E coli (STEC) NOT DETECTED NOT DETECTED Final   Shigella/Enteroinvasive E coli (EIEC) NOT DETECTED NOT DETECTED Final   Cryptosporidium NOT DETECTED NOT DETECTED Final   Cyclospora cayetanensis NOT DETECTED NOT DETECTED Final   Entamoeba histolytica NOT DETECTED NOT DETECTED Final   Giardia lamblia NOT DETECTED NOT DETECTED Final   Adenovirus F40/41 NOT DETECTED NOT DETECTED Final   Astrovirus NOT DETECTED NOT DETECTED Final   Norovirus GI/GII NOT DETECTED NOT DETECTED Final   Rotavirus A NOT DETECTED NOT DETECTED Final   Sapovirus (I, II, IV, and V) NOT DETECTED NOT DETECTED Final    Comment: Performed at [facility], [address] [place], [place] [address]  C difficile quick scan w PCR reflex     Status: None   Collection Time: 12/15/17 10:14 AM  Result Value Ref Range Status   C Diff antigen NEGATIVE NEGATIVE Final   C Diff toxin NEGATIVE NEGATIVE Final   C Diff interpretation No C. difficile detected.  Final    Comment: Performed at [facility], [address] [place], [place] [address]  MRSA PCR Screening     Status: None   Collection Time: 12/15/17  6:46 PM  Result Value Ref Range Status   MRSA by PCR NEGATIVE NEGATIVE Final    Comment:        The GeneXpert MRSA Assay (FDA approved for NASAL specimens only), is one component of a comprehensive MRSA colonization surveillance program. It is not intended to diagnose MRSA infection nor to guide or monitor treatment for MRSA infections. Performed at [facility], [address] [place], [place] [address]     RADIOLOGY:  No results found.   Management plans discussed with the patient, family and they are in agreement.  CODE STATUS:     Code  Status Orders  (From admission, onward)        Start     Ordered   12/15/17 1303  Full code  Continuous     12/15/17 1307    Code Status History    Date Active Date Inactive Code Status Order ID Comments User Context   11/18/2016 00:28 11/21/2016 16:25 Full Code [id number]  Oralia ManisWillis, David, MD Inpatient   09/22/2016 19:54 09/25/2016 18:23 Full Code [id number]  Salley HewsEly, Ralph III, MD ED   12/02/2014 06:22 12/03/2014 16:14 Full Code [id number]  Hillary BowGardner, Jared M, DO Inpatient      TOTAL TIME TAKING CARE OF THIS PATIENT: *40* minutes.    Enedina FinnerSona Shalini Mair M.D on 12/18/2017 at 8:16 AM  Between 7am to 6pm - Pager - [phone] After 6pm go to [email or web address] - password [email or web address]  [facility] [place] [facility]  Office  [phone]  CC: Primary care physician; System, Pcp Not In

## 2017-12-18 NOTE — Clinical Social Work Note (Signed)
The patient will discharge today via family transport [facility]. The CSW has updated the facility who are in agreement.The RNCM is aware of the orders [facility]. The CSW will deliver the discharge packet when able; after which point, the CSW will sign off. Please consult should additional needs arise.  Argentina PonderKaren Martha Isla Sabree, MSW, Theresia MajorsLCSWA [phone]

## 2017-12-18 NOTE — Discharge Instructions (Signed)
Dysphagia level 3(mech soft) w/ MINCED meats, Gravy; Thin liquids. Aspiration precautions; feeding support at meals; reduced distractions d/t Dementia baseline.

## 2017-12-18 NOTE — Progress Notes (Signed)
[facility] w/son, via wc to car.

## 2017-12-18 NOTE — [place] FL2 (Signed)
[place] MEDICAID FL2 LEVEL OF CARE SCREENING TOOL     IDENTIFICATION  Patient Name: Taylor Mack Birthdate: [birth year] Sex: female Admission Date (Current Location): 12/15/2017  [facility] and [place] Number:  Chiropodist and Address:  [facility], [address], [place], [place] [address]      Provider Number: [id number]  Attending Physician Name and Address:  Enedina Finner, MD  Relative Name and Phone Number:       Current Level of Care: Hospital Recommended Level of Care: Memory Care Prior Approval Number:    Date Approved/Denied:   PASRR Number:    Discharge Plan: Domiciliary (Rest home)([facility])    Current Diagnoses: Patient Active Problem List   Diagnosis Date Noted  . Acute renal failure (ARF) (HCC) 12/15/2017  . History of open reduction and internal fixation (ORIF) procedure 11/19/2016  . Elevated blood pressure reading without diagnosis of hypertension 11/19/2016  . Acute renal insufficiency 11/19/2016  . Hypokalemia 11/19/2016  . Anemia 11/19/2016  . MRSA carrier 11/19/2016  . Closed left hip fracture (HCC) 11/17/2016  . Diverticulitis of large intestine with abscess without bleeding 09/22/2016  . Pancreatitis, acute 12/02/2014  . Dementia 12/02/2014  . History of stroke 12/02/2014  . Acute pancreatitis 12/02/2014    Orientation RESPIRATION BLADDER Height & Weight     Self  Normal Incontinent Weight: 152 lb 4.8 oz (69.1 kg) Height:  5\' 5"  (165.1 cm)  BEHAVIORAL SYMPTOMS/MOOD NEUROLOGICAL BOWEL NUTRITION STATUS      Incontinent Diet(Dysphagia 3 with thin liquids)  AMBULATORY STATUS COMMUNICATION OF NEEDS Skin   Supervision Verbally Normal                       Personal Care Assistance Level of Assistance  Bathing, Feeding, Dressing Bathing Assistance: Limited assistance Feeding assistance: Limited assistance Dressing Assistance: Limited assistance     Functional Limitations Info              SPECIAL CARE FACTORS FREQUENCY  PT (By licensed PT)     PT Frequency: TBD by Home Health Provider              Contractures Contractures Info: Not present    Additional Factors Info  Code Status, Allergies, Isolation Precautions, Psychotropic Code Status Info: Full Allergies Info: Codeine, Latex Psychotropic Info: Depakote Sprinkle   Isolation Precautions Info: Droplet precautions (Influenza A)     Current Medications (12/18/2017):  This is the current hospital active medication list Current Facility-Administered Medications  Medication Dose Route Frequency Provider Last Rate Last Dose  . 0.9 %  sodium chloride infusion   Intravenous Continuous Katha Hamming, MD 75 mL/hr at 12/18/17 0300    . acetaminophen (TYLENOL) tablet 650 mg  650 mg Oral Q6H PRN Katha Hamming, MD       Or  . acetaminophen (TYLENOL) suppository 650 mg  650 mg Rectal Q6H PRN Katha Hamming, MD      . atorvastatin (LIPITOR) tablet 20 mg  20 mg Oral q1800 Katha Hamming, MD   20 mg at 12/17/17 1835  . calcium-vitamin D (OSCAL WITH D) 500-200 MG-UNIT per tablet 1 tablet  1 tablet Oral Daily Katha Hamming, MD   1 tablet at 12/17/17 1055  . clopidogrel (PLAVIX) tablet 75 mg  75 mg Oral Daily Katha Hamming, MD   75 mg at 12/17/17 1056  . divalproex (DEPAKOTE SPRINKLE) capsule 250 mg  250 mg Oral TID Katha Hamming, MD   250 mg  at 12/17/17 2239  . donepezil (ARICEPT) tablet 10 mg  10 mg Oral QHS Katha HammingKonidena, Snehalatha, MD   10 mg at 12/17/17 2252  . enoxaparin (LOVENOX) injection 40 mg  40 mg Subcutaneous Q24H Katha HammingKonidena, Snehalatha, MD   40 mg at 12/17/17 2059  . ertapenem (INVANZ) 1 g in sodium chloride 0.9 % 100 mL IVPB  1 g Intravenous Q24H Enedina FinnerPatel, Sona, MD   Stopped at 12/17/17 1313  . [START ON 12/19/2017] ertapenem (INVANZ) injection 1 g  1 g Intramuscular Q24H Enedina FinnerPatel, Sona, MD      . LORazepam (ATIVAN) tablet 0.5 mg  0.5 mg Oral Q8H PRN Katha HammingKonidena, Snehalatha, MD       . Melatonin TABS 5 mg  1 tablet Oral QHS Katha HammingKonidena, Snehalatha, MD   5 mg at 12/17/17 2257  . metoprolol tartrate (LOPRESSOR) tablet 12.5 mg  12.5 mg Oral Daily Katha HammingKonidena, Snehalatha, MD   12.5 mg at 12/16/17 1019  . ondansetron (ZOFRAN) tablet 4 mg  4 mg Oral Q6H PRN Katha HammingKonidena, Snehalatha, MD       Or  . ondansetron (ZOFRAN) injection 4 mg  4 mg Intravenous Q6H PRN Katha HammingKonidena, Snehalatha, MD      . oseltamivir (TAMIFLU) capsule 30 mg  30 mg Oral BID Enedina FinnerPatel, Sona, MD   30 mg at 12/17/17 2238  . pantoprazole (PROTONIX) EC tablet 40 mg  40 mg Oral Daily Katha HammingKonidena, Snehalatha, MD   40 mg at 12/17/17 1056  . phenylephrine-shark liver oil-mineral oil-petrolatum (PREPARATION H) rectal ointment 1 application  1 application Rectal BID PRN Katha HammingKonidena, Snehalatha, MD      . QUEtiapine (SEROQUEL) tablet 25 mg  25 mg Oral QHS Katha HammingKonidena, Snehalatha, MD   25 mg at 12/17/17 2239  . sertraline (ZOLOFT) tablet 50 mg  50 mg Oral QHS Katha HammingKonidena, Snehalatha, MD   50 mg at 12/17/17 2251  . traZODone (DESYREL) tablet 50 mg  50 mg Oral QHS Katha HammingKonidena, Snehalatha, MD   50 mg at 12/17/17 2248  . [START ON 12/23/2017] Vitamin D (Ergocalciferol) (DRISDOL) capsule 50,000 Units  50,000 Units Oral Q30 days Katha HammingKonidena, Snehalatha, MD         Discharge Medications: Medication List             STOP taking these medications            enoxaparin 40 MG/0.4ML injection Commonly known as:  LOVENOX    oxyCODONE 5 MG immediate release tablet Commonly known as:  Oxy IR/ROXICODONE                       TAKE these medications            ACETAMINOPHEN EXTRA STRENGTH 167 MG/5ML Liqd Generic drug:  Acetaminophen Take 15 mLs by mouth 3 (three) times daily.    atorvastatin 20 MG tablet Commonly known as:  LIPITOR Take 20 mg by mouth daily.    calcium-vitamin D 500-200 MG-UNIT tablet Commonly known as:  OSCAL WITH D Take 1 tablet by mouth daily.    Chlorhexidine Gluconate Cloth 2 % Pads Apply 6 each topically daily at 6 (six)  AM.    clopidogrel 75 MG tablet Commonly known as:  PLAVIX Take 75 mg by mouth daily.    divalproex 125 MG capsule Commonly known as:  DEPAKOTE SPRINKLE Take 250 mg by mouth 3 (three) times daily.    donepezil 10 MG tablet Commonly known as:  ARICEPT Take 10 mg by mouth at bedtime.  ertapenem 1 g injection Commonly known as:  INVANZ Inject 1 g into the muscle daily for 4 days. Start taking on:  12/19/2017    Lorazepam Powd Apply 1 application topically every 8 (eight) hours as needed (AGITATION). (LORAZEPAM 0.5MG /ML GEL)    Melatonin 5 MG Tabs Take 1 tablet by mouth at bedtime.    metoprolol tartrate 25 MG tablet Commonly known as:  LOPRESSOR Take 12.5 mg by mouth daily.    mupirocin ointment 2 % Commonly known as:  BACTROBAN Place 1 application into the nose 2 (two) times daily.    oseltamivir 30 MG capsule Commonly known as:  TAMIFLU Take 1 capsule (30 mg total) by mouth 2 (two) times daily for 2 days.    pantoprazole 40 MG tablet Commonly known as:  PROTONIX Take 40 mg by mouth daily.    phenylephrine-shark liver oil-mineral oil-petrolatum 0.25-3-14-71.9 % rectal ointment Commonly known as:  PREPARATION H Place 1 application rectally 2 (two) times daily as needed for hemorrhoids.    polyethylene glycol packet Commonly known as:  MIRALAX / GLYCOLAX Take 17 g by mouth daily.    QUEtiapine 25 MG tablet Commonly known as:  SEROQUEL Take 1 tablet (25 mg total) by mouth at bedtime. What changed:  when to take this    sennosides-docusate sodium 8.6-50 MG tablet Commonly known as:  SENOKOT-S Take 2 tablets by mouth daily.    sertraline 50 MG tablet Commonly known as:  ZOLOFT Take 50 mg by mouth at bedtime.    traZODone 50 MG tablet Commonly known as:  DESYREL Take 50 mg by mouth at bedtime.    Vitamin D (Ergocalciferol) 50000 units Caps capsule Commonly known as:  DRISDOL Take 50,000 Units by mouth every 30 (thirty) days.         Relevant Imaging Results:  Relevant Lab Results:   Additional Information    Judi Cong, LCSW

## 2017-12-24 ENCOUNTER — Emergency Department: Payer: MEDICARE

## 2017-12-24 ENCOUNTER — Emergency Department
Admission: EM | Admit: 2017-12-24 | Discharge: 2017-12-25 | Disposition: A | Discharge status: Home / Self Care | Payer: MEDICARE | Attending: Emergency Medicine | Admitting: Emergency Medicine

## 2017-12-24 ENCOUNTER — Encounter: Payer: Self-pay | Admitting: *Deleted

## 2017-12-24 DIAGNOSIS — S42002A Fracture of unspecified part of left clavicle, initial encounter for closed fracture: Secondary | ICD-10-CM | POA: Insufficient documentation

## 2017-12-24 DIAGNOSIS — S4992XA Unspecified injury of left shoulder and upper arm, initial encounter: Secondary | ICD-10-CM | POA: Diagnosis present

## 2017-12-24 DIAGNOSIS — Z91040 Latex allergy status: Secondary | ICD-10-CM | POA: Diagnosis not present

## 2017-12-24 DIAGNOSIS — F0280 Dementia in other diseases classified elsewhere without behavioral disturbance: Secondary | ICD-10-CM | POA: Diagnosis not present

## 2017-12-24 DIAGNOSIS — Z79899 Other long term (current) drug therapy: Secondary | ICD-10-CM | POA: Diagnosis not present

## 2017-12-24 DIAGNOSIS — W19XXXA Unspecified fall, initial encounter: Secondary | ICD-10-CM | POA: Insufficient documentation

## 2017-12-24 DIAGNOSIS — Y939 Activity, unspecified: Secondary | ICD-10-CM | POA: Insufficient documentation

## 2017-12-24 DIAGNOSIS — G309 Alzheimer's disease, unspecified: Secondary | ICD-10-CM | POA: Diagnosis not present

## 2017-12-24 DIAGNOSIS — Z7902 Long term (current) use of antithrombotics/antiplatelets: Secondary | ICD-10-CM | POA: Insufficient documentation

## 2017-12-24 DIAGNOSIS — Y999 Unspecified external cause status: Secondary | ICD-10-CM | POA: Diagnosis not present

## 2017-12-24 DIAGNOSIS — S0990XA Unspecified injury of head, initial encounter: Secondary | ICD-10-CM | POA: Diagnosis not present

## 2017-12-24 DIAGNOSIS — Z8673 Personal history of transient ischemic attack (TIA), and cerebral infarction without residual deficits: Secondary | ICD-10-CM | POA: Diagnosis not present

## 2017-12-24 DIAGNOSIS — Y92129 Unspecified place in nursing home as the place of occurrence of the external cause: Secondary | ICD-10-CM | POA: Insufficient documentation

## 2017-12-24 MED ORDER — HYDROCODONE-ACETAMINOPHEN 5-325 MG PO TABS: 1.0000 | ORAL_TABLET | Freq: Four times a day (QID) | ORAL | 0 refills | Status: AC | PRN

## 2017-12-24 MED ORDER — FENTANYL CITRATE (PF) 100 MCG/2ML IJ SOLN
50.0000 ug | Freq: Once | INTRAMUSCULAR | Status: AC
  Administered 2017-12-24: 50 ug via NASAL
  Filled 2017-12-24: qty 2

## 2017-12-24 NOTE — ED Provider Notes (Signed)
[facility] Emergency Department Provider Note  ____________________________________________   I have reviewed the triage vital signs and the nursing notes.   HISTORY  Chief Complaint Left shoulder pain  History limited by and level 5 caveat    HPI Taylor GrosBessie L Taylor Mack is a 84 y.o. female who presents to the emergency department today because of concerns for left shoulder pain after a fall.  Patient is coming from memory care unit. Has dementia and cannot give a good reason why she fell. During transport patient complaining primarily of left shoulder pain. Denies any headache or neck pain.    Per medical record review patient has a history of alzheimers.  Past Medical History:  Diagnosis Date  . Alzheimer's dementia   . Pancreatitis   . Stroke (HCC)   . Vascular dementia     Patient Active Problem List   Diagnosis Date Noted  . Acute renal failure (ARF) (HCC) 12/15/2017  . History of open reduction and internal fixation (ORIF) procedure 11/19/2016  . Elevated blood pressure reading without diagnosis of hypertension 11/19/2016  . Acute renal insufficiency 11/19/2016  . Hypokalemia 11/19/2016  . Anemia 11/19/2016  . MRSA carrier 11/19/2016  . Closed left hip fracture (HCC) 11/17/2016  . Diverticulitis of large intestine with abscess without bleeding 09/22/2016  . Pancreatitis, acute 12/02/2014  . Dementia 12/02/2014  . History of stroke 12/02/2014  . Acute pancreatitis 12/02/2014    Past Surgical History:  Procedure Laterality Date  . ABDOMINAL HYSTERECTOMY    . APPENDECTOMY    . CHOLECYSTECTOMY    . INTRAMEDULLARY (IM) NAIL INTERTROCHANTERIC Left 11/18/2016   Procedure: INTRAMEDULLARY (IM) NAIL INTERTROCHANTRIC;  Surgeon: Christena FlakeJohn J Poggi, MD;  Location: [facility] ORS;  Service: Orthopedics;  Laterality: Left;    Prior to Admission medications   Medication Sig Start Date End Date Taking? Authorizing Provider  Acetaminophen (ACETAMINOPHEN EXTRA STRENGTH)  167 MG/5ML LIQD Take 15 mLs by mouth 3 (three) times daily.    [provider]  atorvastatin (LIPITOR) 20 MG tablet Take 20 mg by mouth daily.    [provider]  calcium-vitamin D (OSCAL WITH D) 500-200 MG-UNIT per tablet Take 1 tablet by mouth daily.     [provider]  Chlorhexidine Gluconate Cloth 2 % PADS Apply 6 each topically daily at 6 (six) AM. Patient not taking: Reported on 09/06/2017 11/19/16   Katharina CaperVaickute, Rima, MD  clopidogrel (PLAVIX) 75 MG tablet Take 75 mg by mouth daily.    [provider]  divalproex (DEPAKOTE SPRINKLE) 125 MG capsule Take 250 mg by mouth 3 (three) times daily.     [provider]  donepezil (ARICEPT) 10 MG tablet Take 10 mg by mouth at bedtime.    [provider]  Lorazepam POWD Apply 1 application topically every 8 (eight) hours as needed (AGITATION). (LORAZEPAM 0.5MG /ML GEL)    [provider]  Melatonin 5 MG TABS Take 1 tablet by mouth at bedtime.    [provider]  metoprolol tartrate (LOPRESSOR) 25 MG tablet Take 12.5 mg by mouth daily.    [provider]  mupirocin ointment (BACTROBAN) 2 % Place 1 application into the nose 2 (two) times daily. Patient not taking: Reported on 09/06/2017 11/19/16   Katharina CaperVaickute, Rima, MD  pantoprazole (PROTONIX) 40 MG tablet Take 40 mg by mouth daily.    [provider]  phenylephrine-shark liver oil-mineral oil-petrolatum (PREPARATION H) 0.25-3-14-71.9 % rectal ointment Place 1 application rectally 2 (two) times daily as needed for hemorrhoids.  [provider]  polyethylene glycol (MIRALAX / GLYCOLAX) packet Take 17 g by mouth daily.    [provider]  QUEtiapine (SEROQUEL) 25 MG tablet Take 1 tablet (25 mg total) by mouth at bedtime. Patient taking differently: Take 25 mg by mouth daily.  09/13/15   [name], MD  sennosides-docusate sodium (SENOKOT-S) 8.6-50 MG tablet Take 2 tablets by mouth daily.    [provider]  sertraline (ZOLOFT) 50 MG tablet Take 50 mg by mouth at bedtime.    [provider]  traZODone (DESYREL) 50 MG tablet Take 50 mg by mouth at bedtime.  10/18/16   [provider]  Vitamin D, Ergocalciferol, (DRISDOL) 50000 UNITS CAPS capsule Take 50,000 Units by mouth every 30 (thirty) days.     [provider]    Allergies Codeine and Latex  Family History  Family history unknown: Yes    Social History Social History   Tobacco Use  . Smoking status: Never Smoker  . Smokeless tobacco: Never Used  Substance Use Topics  . Alcohol use: No    Alcohol/week: 0.0 oz  . Drug use: No    Review of Systems Constitutional: No fever/chills Eyes: No visual changes. ENT: No sore throat. Cardiovascular: Denies chest pain. Respiratory: Denies shortness of breath. Gastrointestinal: No abdominal pain.  No nausea, no vomiting.  No diarrhea.   Genitourinary: Negative for dysuria. Musculoskeletal: Positive for left shoulder pain. Skin: Negative for rash. Neurological: Negative for headaches, focal weakness or numbness.  ____________________________________________   PHYSICAL EXAM:  VITAL SIGNS: ED Triage Vitals [12/24/17 2056]  Enc Vitals Group     BP (!) 177/82     Pulse Rate 60     Resp 18     Temp (!) 97.3 F (36.3 C)     Temp Source Oral     SpO2 97 %   Constitutional: Awake and alert. Not completely oriented. Eyes: Conjunctivae are normal.  ENT   Head: Normocephalic and atraumatic.   Nose: No congestion/rhinnorhea.   Mouth/Throat: Mucous membranes are moist.   Neck: No stridor. Hematological/Lymphatic/Immunilogical: No cervical lymphadenopathy. Cardiovascular: Normal rate, regular rhythm.  No murmurs, rubs, or gallops. Respiratory: Normal respiratory effort without tachypnea nor retractions. Breath sounds are clear and equal bilaterally. No wheezes/rales/rhonchi. Gastrointestinal: Soft and non tender. No rebound. No  guarding.  Genitourinary: Deferred Musculoskeletal: Left shoulder and left upper chest tender to palpation. No obvious deformity. No lower extremity tenderness.  Neurologic:  Normal speech and language. No gross focal neurologic deficits are appreciated.  Skin:  Skin is warm, dry and intact. No rash noted. Psychiatric: Mood and affect are normal. Speech and behavior are normal. Patient exhibits appropriate insight and judgment.  ____________________________________________    LABS (pertinent positives/negatives)  None  ____________________________________________   EKG  None  ____________________________________________    RADIOLOGY  Left shoulder x-ray Clavicle fracture  CT head/cervical spine No acute process.   ____________________________________________   PROCEDURES  Procedures  ____________________________________________   INITIAL IMPRESSION / ASSESSMENT AND PLAN / ED COURSE  Pertinent labs & imaging results that were available during my care of the patient were reviewed by me and considered in my medical decision making (see chart for details).  Patient presented to the emergency department today because of concerns for left shoulder pain after a fall.  I did have a discussion with the patient's son.  At this point he feels comfortable deferring blood work.  Patient does have a history of frequent falls.  X-ray  of the left shoulder does show a nondisplaced clavicle fracture.  Patient had a CT head and cervical spine x-rays done.  These were both negative.  Discussed finding with patient's son.  Will discharge with pain medications.   ____________________________________________   FINAL CLINICAL IMPRESSION(S) / ED DIAGNOSES  Final diagnoses:  Fall, initial encounter  Closed nondisplaced fracture of left clavicle, unspecified part of clavicle, initial encounter     Note: This dictation was prepared with Dragon dictation. Any transcriptional errors  that result from this process are unintentional     Phineas Semen, MD 12/24/17 [phone]

## 2017-12-24 NOTE — ED Notes (Signed)
Dr Derrill KayGoodman to bedside to assess pt and will order xrays and CT's. Does not want blood or EKG at this time.

## 2017-12-24 NOTE — ED Triage Notes (Signed)
[facility] with unwitnessed fall. She c/o pain in the left shoulder area. Pt has frequent falls. Pt is on Plavix

## 2017-12-24 NOTE — ED Notes (Signed)
Pt has small red area to left outer region of forehead and son reports that is new. Pt is alert and oriented to person and place which is her baseline.

## 2017-12-24 NOTE — Discharge Instructions (Signed)
Please have Ms. Taylor Mack take her arm out of the sling every couple of hours and work on range of motion of her shoulder to prevent joint issues. Please seek medical attention for any high fevers, chest pain, shortness of breath, change in behavior, persistent vomiting, bloody stool or any other new or concerning symptoms.

## 2017-12-24 NOTE — ED Notes (Signed)
[facility]

## 2017-12-25 ENCOUNTER — Emergency Department
Admission: EM | Admit: 2017-12-25 | Discharge: 2017-12-25 | Disposition: A | Discharge status: Home / Self Care | Payer: MEDICARE | Source: Home / Self Care | Attending: Student in an Organized Health Care Education/Training Program | Admitting: Student in an Organized Health Care Education/Training Program

## 2017-12-25 ENCOUNTER — Other Ambulatory Visit: Payer: Self-pay

## 2017-12-25 DIAGNOSIS — Z79899 Other long term (current) drug therapy: Secondary | ICD-10-CM

## 2017-12-25 DIAGNOSIS — Z7902 Long term (current) use of antithrombotics/antiplatelets: Secondary | ICD-10-CM | POA: Insufficient documentation

## 2017-12-25 DIAGNOSIS — G308 Other Alzheimer's disease: Secondary | ICD-10-CM

## 2017-12-25 DIAGNOSIS — W19XXXA Unspecified fall, initial encounter: Secondary | ICD-10-CM | POA: Insufficient documentation

## 2017-12-25 DIAGNOSIS — Y929 Unspecified place or not applicable: Secondary | ICD-10-CM

## 2017-12-25 DIAGNOSIS — Y939 Activity, unspecified: Secondary | ICD-10-CM

## 2017-12-25 DIAGNOSIS — F0280 Dementia in other diseases classified elsewhere without behavioral disturbance: Secondary | ICD-10-CM

## 2017-12-25 DIAGNOSIS — S0990XA Unspecified injury of head, initial encounter: Secondary | ICD-10-CM

## 2017-12-25 DIAGNOSIS — Y999 Unspecified external cause status: Secondary | ICD-10-CM | POA: Insufficient documentation

## 2017-12-25 DIAGNOSIS — S42002A Fracture of unspecified part of left clavicle, initial encounter for closed fracture: Secondary | ICD-10-CM | POA: Diagnosis not present

## 2017-12-25 DIAGNOSIS — Z91040 Latex allergy status: Secondary | ICD-10-CM

## 2017-12-25 NOTE — ED Triage Notes (Signed)
Unwitnessed fall - was sitting in the w/c then found on floor. Pt from [facility] with hx of multiple falls. Bump to back of head. Fell yesterday and has fx clavicle (left).

## 2017-12-25 NOTE — ED Provider Notes (Signed)
[facility] Emergency Department Provider Note    First MD Initiated Contact with Patient 12/25/17 1458     (approximate)  I have reviewed the triage vital signs and the nursing notes.   HISTORY  Chief Complaint Fall    HPI Taylor Mack is a 84 y.o. female with history of stroke as well as vascular dementia presents to the ER after being found on the floor with concern for head injury.  Was unwitnessed fall.  Does have small contusion the back of her forehead.  Patient pleasant and in no acute distress.  Denies any pain.  No headache.  No nausea or vomiting.  Patient seen yesterday for similar fall.    Past Medical History:  Diagnosis Date  . Alzheimer's dementia   . Pancreatitis   . Stroke (HCC)   . Vascular dementia    Family History  Family history unknown: Yes   Past Surgical History:  Procedure Laterality Date  . ABDOMINAL HYSTERECTOMY    . APPENDECTOMY    . CHOLECYSTECTOMY    . INTRAMEDULLARY (IM) NAIL INTERTROCHANTERIC Left 11/18/2016   Procedure: INTRAMEDULLARY (IM) NAIL INTERTROCHANTRIC;  Surgeon: Christena Flake, MD;  Location: [facility] ORS;  Service: Orthopedics;  Laterality: Left;   Patient Active Problem List   Diagnosis Date Noted  . Acute renal failure (ARF) (HCC) 12/15/2017  . History of open reduction and internal fixation (ORIF) procedure 11/19/2016  . Elevated blood pressure reading without diagnosis of hypertension 11/19/2016  . Acute renal insufficiency 11/19/2016  . Hypokalemia 11/19/2016  . Anemia 11/19/2016  . MRSA carrier 11/19/2016  . Closed left hip fracture (HCC) 11/17/2016  . Diverticulitis of large intestine with abscess without bleeding 09/22/2016  . Pancreatitis, acute 12/02/2014  . Dementia 12/02/2014  . History of stroke 12/02/2014  . Acute pancreatitis 12/02/2014      Prior to Admission medications   Medication Sig Start Date End Date Taking? Authorizing Provider  Acetaminophen (ACETAMINOPHEN EXTRA  STRENGTH) 167 MG/5ML LIQD Take 15 mLs by mouth 3 (three) times daily.    [provider]  atorvastatin (LIPITOR) 20 MG tablet Take 20 mg by mouth daily.    [provider]  calcium-vitamin D (OSCAL WITH D) 500-200 MG-UNIT per tablet Take 1 tablet by mouth daily.     [provider]  Chlorhexidine Gluconate Cloth 2 % PADS Apply 6 each topically daily at 6 (six) AM. Patient not taking: Reported on 09/06/2017 11/19/16   [name], MD  clopidogrel (PLAVIX) 75 MG tablet Take 75 mg by mouth daily.    [provider]  divalproex (DEPAKOTE SPRINKLE) 125 MG capsule Take 250 mg by mouth 3 (three) times daily.     [provider]  donepezil (ARICEPT) 10 MG tablet Take 10 mg by mouth at bedtime.    [provider]  HYDROcodone-acetaminophen (NORCO) 5-325 MG tablet Take 1 tablet by mouth every 6 (six) hours as needed for moderate pain or severe pain. 12/24/17   Phineas Semen, MD  Lorazepam POWD Apply 1 application topically every 8 (eight) hours as needed (AGITATION). (LORAZEPAM 0.5MG /ML GEL)    [provider]  Melatonin 5 MG TABS Take 1 tablet by mouth at bedtime.    [provider]  metoprolol tartrate (LOPRESSOR) 25 MG tablet Take 12.5 mg by mouth daily.    [provider]  mupirocin ointment (BACTROBAN) 2 % Place 1 application into the nose 2 (two) times daily. Patient not taking: Reported on 09/06/2017 11/19/16  [name], MD  pantoprazole (PROTONIX) 40 MG tablet Take 40 mg by mouth daily.    [provider]  phenylephrine-shark liver oil-mineral oil-petrolatum (PREPARATION H) 0.25-3-14-71.9 % rectal ointment Place 1 application rectally 2 (two) times daily as needed for hemorrhoids.    [provider]  polyethylene glycol (MIRALAX / GLYCOLAX) packet Take 17 g by mouth daily.    [provider]  QUEtiapine (SEROQUEL) 25 MG tablet Take 1 tablet (25 mg total) by mouth at bedtime. Patient  taking differently: Take 25 mg by mouth daily.  09/13/15   [name], MD  sennosides-docusate sodium (SENOKOT-S) 8.6-50 MG tablet Take 2 tablets by mouth daily.    [provider]  sertraline (ZOLOFT) 50 MG tablet Take 50 mg by mouth at bedtime.    [provider]  traZODone (DESYREL) 50 MG tablet Take 50 mg by mouth at bedtime.  10/18/16   [provider]  Vitamin D, Ergocalciferol, (DRISDOL) 50000 UNITS CAPS capsule Take 50,000 Units by mouth every 30 (thirty) days.     [provider]    Allergies Codeine and Latex    Social History Social History   Tobacco Use  . Smoking status: Never Smoker  . Smokeless tobacco: Never Used  Substance Use Topics  . Alcohol use: No    Alcohol/week: 0.0 oz  . Drug use: No    Review of Systems Patient denies headaches, rhinorrhea, blurry vision, numbness, shortness of breath, chest pain, edema, cough, abdominal pain, nausea, vomiting, diarrhea, dysuria, fevers, rashes or hallucinations unless otherwise stated above in HPI. ____________________________________________   PHYSICAL EXAM:  VITAL SIGNS: Vitals:   12/25/17 1441  BP: (!) 129/43  Pulse: (!) 59  Resp: 18  Temp: 98.1 F (36.7 C)  SpO2: 99%    Constitutional: Alert, well appearing and in no acute distress. Eyes: Conjunctivae are normal.  Head: small 2cm hematoma and contusion to left occipital scalp, no hemotympanum, no battle sign  Nose: No congestion/rhinnorhea. Mouth/Throat: Mucous membranes are moist.   Neck: No stridor. Painless ROM.  No c spine ttp Cardiovascular: Normal rate, regular rhythm. Grossly normal heart sounds.  Good peripheral circulation. Respiratory: Normal respiratory effort.  No retractions. Lungs CTAB. Gastrointestinal: Soft and nontender. No distention. No abdominal bruits. No CVA tenderness. Genitourinary:  Musculoskeletal: No lower extremity tenderness nor edema.  No joint effusions.  LUE in  immobilzer Neurologic:  Normal speech and language. No gross focal neurologic deficits are appreciated. No facial droop Skin:  Skin is warm, dry and intact. No rash noted. Psychiatric: Mood and affect are normal. Speech and behavior are normal.  ____________________________________________   LABS (all labs ordered are listed, but only abnormal results are displayed)  No results found for this or any previous visit (from the past 24 hour(s)). ____________________________________________  ____________________________________________   PROCEDURES  Procedure(s) performed:  Procedures    Critical Care performed: no ____________________________________________   INITIAL IMPRESSION / ASSESSMENT AND PLAN / ED COURSE  Pertinent labs & imaging results that were available during my care of the patient were reviewed by me and considered in my medical decision making (see chart for details).  DDX: sdh, iph, chva, fracture, sah, contusion  Damarys L Doring is a 84 y.o. who presents to the ED with vascular dementia presents after unwitnessed fall.  Patient well-appearing and in no acute distress.  Exam as above and is reassuring.  Patient has had multiple visits to the ER for frequent falls.  I spoke with the patient's  medical decision-maker, [name] (son), regarding the patient's presentation.  Did offer further diagnostic imaging but also relayed my assessment that she is otherwise well-appearing.  Did discuss that there is a chance that the patient has a intracranial injury but also wanted to discuss goals of care and whether they would seek further intervention if there were to be an acute intracranial injury.  After discussion with the patient's son and after my reassessment which is reassuring elected not to orders emergent CT imaging at this time as goals of care are comfort.  [facility].      As part of my medical decision making, I reviewed the  following data within the electronic MEDICAL RECORD NUMBER Nursing notes reviewed and incorporated, Labs reviewed, notes from prior ED visits .  ____________________________________________   FINAL CLINICAL IMPRESSION(S) / ED DIAGNOSES  Final diagnoses:  Fall, initial encounter  Minor head injury, initial encounter      NEW MEDICATIONS STARTED DURING THIS VISIT:  New Prescriptions   No medications on file     Note:  This document was prepared using Dragon voice recognition software and may include unintentional dictation errors.    Willy Eddyobinson, Johniece Hornbaker, MD 12/25/17 [phone]

## 2017-12-25 NOTE — ED Notes (Signed)
Ems on scene for discharge to [facility] - son previously called to inform him she would be [facility]

## 2017-12-25 NOTE — Discharge Instructions (Signed)
Please return to the ER should you develop any worsening symptoms including headache, blurred vision, numbness or tingling, change in behavior or for any additional questions or concerns.

## 2018-06-28 ENCOUNTER — Emergency Department
Admission: EM | Admit: 2018-06-28 | Discharge: 2018-06-28 | Disposition: A | Discharge status: Home / Self Care | Payer: MEDICARE | Attending: Emergency Medicine | Admitting: Emergency Medicine

## 2018-06-28 ENCOUNTER — Emergency Department: Payer: MEDICARE

## 2018-06-28 ENCOUNTER — Other Ambulatory Visit: Payer: Self-pay

## 2018-06-28 DIAGNOSIS — S42018A Nondisplaced fracture of sternal end of left clavicle, initial encounter for closed fracture: Secondary | ICD-10-CM

## 2018-06-28 DIAGNOSIS — Y998 Other external cause status: Secondary | ICD-10-CM | POA: Insufficient documentation

## 2018-06-28 DIAGNOSIS — R8271 Bacteriuria: Secondary | ICD-10-CM | POA: Insufficient documentation

## 2018-06-28 DIAGNOSIS — Y9389 Activity, other specified: Secondary | ICD-10-CM | POA: Insufficient documentation

## 2018-06-28 DIAGNOSIS — Z8673 Personal history of transient ischemic attack (TIA), and cerebral infarction without residual deficits: Secondary | ICD-10-CM | POA: Diagnosis not present

## 2018-06-28 DIAGNOSIS — S0003XA Contusion of scalp, initial encounter: Secondary | ICD-10-CM | POA: Diagnosis not present

## 2018-06-28 DIAGNOSIS — W0110XA Fall on same level from slipping, tripping and stumbling with subsequent striking against unspecified object, initial encounter: Secondary | ICD-10-CM | POA: Insufficient documentation

## 2018-06-28 DIAGNOSIS — G309 Alzheimer's disease, unspecified: Secondary | ICD-10-CM | POA: Insufficient documentation

## 2018-06-28 DIAGNOSIS — S4992XA Unspecified injury of left shoulder and upper arm, initial encounter: Secondary | ICD-10-CM | POA: Diagnosis present

## 2018-06-28 DIAGNOSIS — Y92129 Unspecified place in nursing home as the place of occurrence of the external cause: Secondary | ICD-10-CM | POA: Diagnosis not present

## 2018-06-28 DIAGNOSIS — W19XXXA Unspecified fall, initial encounter: Secondary | ICD-10-CM

## 2018-06-28 DIAGNOSIS — Z91040 Latex allergy status: Secondary | ICD-10-CM | POA: Insufficient documentation

## 2018-06-28 DIAGNOSIS — F0280 Dementia in other diseases classified elsewhere without behavioral disturbance: Secondary | ICD-10-CM | POA: Insufficient documentation

## 2018-06-28 DIAGNOSIS — Z9049 Acquired absence of other specified parts of digestive tract: Secondary | ICD-10-CM | POA: Diagnosis not present

## 2018-06-28 LAB — COMPREHENSIVE METABOLIC PANEL
ALK PHOS: 64 U/L (ref 38–126)
ALT: 16 U/L (ref 0–44)
ANION GAP: 8 (ref 5–15)
AST: 27 U/L (ref 15–41)
Albumin: 3.9 g/dL (ref 3.5–5.0)
BILIRUBIN TOTAL: 0.8 mg/dL (ref 0.3–1.2)
BUN: 22 mg/dL (ref 8–23)
CALCIUM: 9.4 mg/dL (ref 8.9–10.3)
CO2: 29 mmol/L (ref 22–32)
CREATININE: 1.05 mg/dL — AB (ref 0.44–1.00)
Chloride: 104 mmol/L (ref 98–111)
GFR, EST AFRICAN AMERICAN: 56 mL/min — AB (ref >60)
GFR, EST NON AFRICAN AMERICAN: 48 mL/min — AB (ref >60)
Glucose, Bld: 114 mg/dL — ABNORMAL HIGH (ref 70–99)
Potassium: 4 mmol/L (ref 3.5–5.1)
SODIUM: 141 mmol/L (ref 135–145)
TOTAL PROTEIN: 7.2 g/dL (ref 6.5–8.1)

## 2018-06-28 LAB — URINALYSIS, COMPLETE (UACMP) WITH MICROSCOPIC
Bilirubin Urine: NEGATIVE
Glucose, UA: NEGATIVE mg/dL
HGB URINE DIPSTICK: NEGATIVE
Ketones, ur: NEGATIVE mg/dL
LEUKOCYTES UA: NEGATIVE
NITRITE: NEGATIVE
PH: 6 (ref 5.0–8.0)
Protein, ur: NEGATIVE mg/dL
SPECIFIC GRAVITY, URINE: 1.021 (ref 1.005–1.030)

## 2018-06-28 LAB — CBC
HEMATOCRIT: 36.8 % (ref 35.0–47.0)
HEMOGLOBIN: 12.7 g/dL (ref 12.0–16.0)
MCH: 30.6 pg (ref 26.0–34.0)
MCHC: 34.4 g/dL (ref 32.0–36.0)
MCV: 88.9 fL (ref 80.0–100.0)
Platelets: 167 10*3/uL (ref 150–440)
RBC: 4.14 MIL/uL (ref 3.80–5.20)
RDW: 13.6 % (ref 11.5–14.5)
WBC: 9.4 10*3/uL (ref 3.6–11.0)

## 2018-06-28 LAB — TROPONIN I

## 2018-06-28 MED ORDER — CEPHALEXIN 500 MG PO CAPS: 500.0000 mg | ORAL_CAPSULE | Freq: Three times a day (TID) | ORAL | 0 refills | Status: AC

## 2018-06-28 MED ORDER — HALOPERIDOL LACTATE 5 MG/ML IJ SOLN: 1.0000 mg | Freq: Once | INTRAMUSCULAR | Status: DC | PRN

## 2018-06-28 NOTE — Discharge Instructions (Addendum)
Today, Mrs. Taylor Mack has a clavicle fracture and will need to keep her shoulder immobilizer on at all times until she is cleared by the orthopedist to remove it.  She may take Tylenol for her pain.  She may also apply ice for 10 minutes every 2 hours to decrease pain around the clavicle and neck.  Today, the patient has some bacteria in her urine; she will take a 5-day course of Keflex.  Please have her physician follow-up the results of her urine culture.  Return to the emergency department for severe pain, changes in mental status, nausea or vomiting, fever, or any other symptoms concerning to you.

## 2018-06-28 NOTE — ED Notes (Signed)
Pt transported to CT and x-ray  

## 2018-06-28 NOTE — ED Notes (Signed)
Sling immobilizer applied to pt's left arm.

## 2018-06-28 NOTE — ED Provider Notes (Signed)
[facility] Emergency Department Provider Note  ____________________________________________  Time seen: Approximately 10:42 AM  I have reviewed the triage vital signs and the nursing notes.   HISTORY  Chief Complaint Fall    HPI Taylor Mack is a 84 y.o. female with a history of vascular and Alzheimer's dementia, prior CVA on Plavix, brought to the emergency department for fall.  The patient is unable to give any history due to her end-stage dementia.  Per report, the patient was in a standing position when she fell forward onto her left side.  The patient reports pain on the left side of the face and the left lateral neck.  Her son is here, who reports agitation for the past several days which is usually consistent with UTI in the past.  Upon arrival, the patient was agitated and hitting the staff, but has remained calm since her son arrived.  According to her son, baseline, she is mostly in a wheelchair but is able to stand or walk small distances with significant assistance.  There is no recent history of fever, nausea or vomiting, and the patient denies any headache.  Past Medical History:  Diagnosis Date  . Alzheimer's dementia   . Pancreatitis   . Stroke (HCC)   . Vascular dementia     Patient Active Problem List   Diagnosis Date Noted  . Acute renal failure (ARF) (HCC) 12/15/2017  . History of open reduction and internal fixation (ORIF) procedure 11/19/2016  . Elevated blood pressure reading without diagnosis of hypertension 11/19/2016  . Acute renal insufficiency 11/19/2016  . Hypokalemia 11/19/2016  . Anemia 11/19/2016  . MRSA carrier 11/19/2016  . Closed left hip fracture (HCC) 11/17/2016  . Diverticulitis of large intestine with abscess without bleeding 09/22/2016  . Pancreatitis, acute 12/02/2014  . Dementia 12/02/2014  . History of stroke 12/02/2014  . Acute pancreatitis 12/02/2014    Past Surgical History:  Procedure Laterality Date   . ABDOMINAL HYSTERECTOMY    . APPENDECTOMY    . CHOLECYSTECTOMY    . INTRAMEDULLARY (IM) NAIL INTERTROCHANTERIC Left 11/18/2016   Procedure: INTRAMEDULLARY (IM) NAIL INTERTROCHANTRIC;  Surgeon: Christena Flake, MD;  Location: [facility] ORS;  Service: Orthopedics;  Laterality: Left;    Current Outpatient Rx  . Order #: [id number] Class: Historical Med  . Order #: [id number] Class: Historical Med  . Order #: [id number] Class: Historical Med  . Order #: [id number] Class: Normal  . Order #: [id number] Class: Historical Med  . Order #: [id number] Class: Historical Med  . Order #: [id number] Class: Historical Med  . Order #: [id number] Class: Print  . Order #: [id number] Class: Historical Med  . Order #: [id number] Class: Historical Med  . Order #: [id number] Class: Historical Med  . Order #: [id number] Class: Normal  . Order #: [id number] Class: Historical Med  . Order #: [id number] Class: Historical Med  . Order #: [id number] Class: Historical Med  . Order #: [id number] Class: Print  . Order #: [id number] Class: Historical Med  . Order #: [id number] Class: Historical Med  . Order #: [id number] Class: Historical Med  . Order #: [id number] Class: Historical Med    Allergies Codeine and Latex  Family History  Family history unknown: Yes    Social History Social History   Tobacco Use  . Smoking status: Never Smoker  . Smokeless tobacco: Never Used  Substance Use Topics  . Alcohol use: No    Alcohol/week: 0.0 standard drinks  . Drug use: No    Review of Systems Limited due to patient dementia; the patient is unable to answer  questions.  ____________________________________________   PHYSICAL EXAM:  VITAL SIGNS: ED Triage Vitals  Enc Vitals Group     BP 06/28/18 1024 122/77     Pulse Rate 06/28/18 0958 68     Resp 06/28/18 0958 18     Temp 06/28/18 1024 98.1 F (36.7 C)     Temp Source 06/28/18 0958 Oral     SpO2 06/28/18 0958 100 %     Weight 06/28/18 0959 143 lb 6.4 oz (65 kg)     Height --      Head  Circumference --      Peak Flow --      Pain Score --      Pain Loc --      Pain Edu? --      Excl. in GC? --     Constitutional: The patient is alert but not oriented.  GCS is 15.  She is able to follow basic commands. Eyes: Conjunctivae are normal.  EOMI. No scleral icterus.  No raccoon eyes. Head: The patient has a 3 x 3 cm hematoma in the posterior scalp with ecchymosis but no laceration or abrasion.. Nose: No congestion/rhinnorhea. Mouth/Throat: Mucous membranes are moist.  No dental injury or malocclusion. Neck: No stridor.  Supple.  No midline C-spine tenderness to palpation, step-offs or deformities.  The patient does have some tenderness to palpation in the left lateral neck. Cardiovascular: Normal rate, regular rhythm. No murmurs, rubs or gallops.  Respiratory: Normal respiratory effort.  No accessory muscle use or retractions. Lungs CTAB.  No wheezes, rales or ronchi. Gastrointestinal: Soft, nontender and nondistended.  No guarding or rebound.  No peritoneal signs. Musculoskeletal: Pelvis is stable.  No obvious extremity injury.  No LE edema. No ttp in the calves or palpable cords.  Negative Homan's sign. Neurologic:  A&Ox1.  Initially patient is agitated, but this resolves once her son arrives.  Speech is clear.  Face and smile are symmetric.  EOMI.  Moves all extremities well. Skin:  Skin is warm, dry and intact. No rash noted. Psychiatric: See above  ____________________________________________   LABS (all labs ordered are listed, but only abnormal results are displayed)  Labs Reviewed  CBC  COMPREHENSIVE METABOLIC PANEL  URINALYSIS, COMPLETE (UACMP) WITH MICROSCOPIC  TROPONIN I   ____________________________________________  EKG  ED ECG REPORT I, Anne-Caroline Sharma CovertNorman, the attending physician, personally viewed and interpreted this ECG.   Date: 06/28/2018  EKG Time: 1117  Rate: 61  Rhythm: normal sinus rhythm  Axis: normal  Intervals:none  ST&T Change: No  STEMI  ____________________________________________  RADIOLOGY  Dg Chest 1 View  Result Date: 06/28/2018 CLINICAL DATA:  Trauma secondary to a fall.  Alzheimer's. EXAM: CHEST  1 VIEW COMPARISON:  05/30/2017 FINDINGS: Heart size and pulmonary vascularity are normal. Lungs are clear. No acute bone abnormality. IMPRESSION: No acute abnormalities. Electronically Signed   By: Francene BoyersJames  Maxwell M.D.   On: 06/28/2018 11:19   Ct Head Wo Contrast  Result Date: 06/28/2018 CLINICAL DATA:  Ataxia.  Fall.  On Plavix. EXAM: CT HEAD WITHOUT CONTRAST CT CERVICAL SPINE WITHOUT CONTRAST TECHNIQUE: Multidetector CT imaging of the head and cervical spine was performed following the standard protocol without intravenous contrast. Multiplanar CT image reconstructions of the cervical spine were also generated. COMPARISON:  12/24/2017 FINDINGS: CT HEAD FINDINGS Brain: There is atrophy and chronic small vessel disease changes. Associated ventriculomegaly. Findings are stable. No acute intracranial abnormality. Specifically, no hemorrhage, hydrocephalus, mass lesion, acute infarction, or significant intracranial injury. Vascular:  No hyperdense vessel or unexpected calcification. Skull: No acute calvarial abnormality. Sinuses/Orbits: Visualized paranasal sinuses and mastoids clear. Orbital soft tissues unremarkable. Other: None CT CERVICAL SPINE FINDINGS Alignment: No subluxation Skull base and vertebrae: No acute fracture. No primary bone lesion or focal pathologic process. Soft tissues and spinal canal: Choose 1 Disc levels: Diffuse degenerative disc and facet disease. Degenerative disc disease is most pronounced at C5-6. Upper chest: There is a fracture noted within the proximal left clavicle in the region of the clavicle head. Stranding noted within the soft tissues adjacent to the left clavicle. Other: No acute findings IMPRESSION: Diffuse cerebral atrophy and chronic small vessel disease. No acute intracranial abnormality.  Fracture within the proximal left clavicle/clavicular head region with surrounding soft tissue stranding. Diffuse degenerative disc and facet disease in the cervical spine. No acute bony abnormality within the cervical spine. Electronically Signed   By: Charlett Nose M.D.   On: 06/28/2018 11:12   Ct Cervical Spine Wo Contrast  Result Date: 06/28/2018 CLINICAL DATA:  Ataxia.  Fall.  On Plavix. EXAM: CT HEAD WITHOUT CONTRAST CT CERVICAL SPINE WITHOUT CONTRAST TECHNIQUE: Multidetector CT imaging of the head and cervical spine was performed following the standard protocol without intravenous contrast. Multiplanar CT image reconstructions of the cervical spine were also generated. COMPARISON:  12/24/2017 FINDINGS: CT HEAD FINDINGS Brain: There is atrophy and chronic small vessel disease changes. Associated ventriculomegaly. Findings are stable. No acute intracranial abnormality. Specifically, no hemorrhage, hydrocephalus, mass lesion, acute infarction, or significant intracranial injury. Vascular: No hyperdense vessel or unexpected calcification. Skull: No acute calvarial abnormality. Sinuses/Orbits: Visualized paranasal sinuses and mastoids clear. Orbital soft tissues unremarkable. Other: None CT CERVICAL SPINE FINDINGS Alignment: No subluxation Skull base and vertebrae: No acute fracture. No primary bone lesion or focal pathologic process. Soft tissues and spinal canal: Choose 1 Disc levels: Diffuse degenerative disc and facet disease. Degenerative disc disease is most pronounced at C5-6. Upper chest: There is a fracture noted within the proximal left clavicle in the region of the clavicle head. Stranding noted within the soft tissues adjacent to the left clavicle. Other: No acute findings IMPRESSION: Diffuse cerebral atrophy and chronic small vessel disease. No acute intracranial abnormality. Fracture within the proximal left clavicle/clavicular head region with surrounding soft tissue stranding. Diffuse  degenerative disc and facet disease in the cervical spine. No acute bony abnormality within the cervical spine. Electronically Signed   By: Charlett Nose M.D.   On: 06/28/2018 11:12    ____________________________________________   PROCEDURES  Procedure(s) performed: None  Procedures  Critical Care performed: No ____________________________________________   INITIAL IMPRESSION / ASSESSMENT AND PLAN / ED COURSE  Pertinent labs & imaging results that were available during my care of the patient were reviewed by me and considered in my medical decision making (see chart for details).  84 y.o. female a history of dementia presenting with a fall from standing height; the patient is on Plavix.  The patient does have a hematoma in the posterior scalp so get a CT of the head and C-spine.  She has some left lateral neck pain but no midline pain.  We will also do some basic laboratory studies including UA to evaluate for her recent agitation.  At this time, the patient is calm and cooperative.  Plan reevaluation for final disposition.  ED course: The patient CT scan did not show any intracranial process or cervical spine injury, however she does have a left medial clavicular fracture with some stranding, which is suggestive  of new fracture although the patient did have a fracture in the same area recently.  The patient's urinalysis shows bacteriuria without other evidence of infection; she will be placed on 5 days of antibiotics and a urine culture has been sent.  The remainder of her studies have been reassuring.  Clinically, she continues to be calm and cooperative.  She is safe for discharge at this time.  Return precautions as well as follow-up instructions were discussed with the patient's son as well as printed onto her discharge paperwork  ____________________________________________  FINAL CLINICAL IMPRESSION(S) / ED DIAGNOSES  Final diagnoses:  Closed nondisplaced fracture of sternal end  of left clavicle, initial encounter  Fall, initial encounter  Contusion of scalp, initial encounter         NEW MEDICATIONS STARTED DURING THIS VISIT:  New Prescriptions   No medications on file      Rockne Menghini, MD 06/28/18 1412

## 2018-06-28 NOTE — ED Notes (Addendum)
Son at bedside. States at doctors making house calls wanted pt to have [facility] this [facility] d/t staff reporting increased urination. Pt much more calm and cooperative with son at bedside.

## 2018-06-28 NOTE — ED Triage Notes (Addendum)
[facility] for [facility]. EMS reports that facility reports hematoma to posterior head. No bleeding noted. Pt is refusing all care and talking about hitting staff. Pt speech is inappropriate toward staff and pt telling staff she is going to spit on them.

## 2018-06-30 LAB — URINE CULTURE: Culture: NO GROWTH

## 2018-12-29 IMAGING — CT CT HEAD W/O CM
4 of 8 series · 15 of 47 positions shown, 16 images · non-contrast
Comparison: CT head and CT cervical spine November 05, 2017. Prior
head CT July 22, 2016

CLINICAL DATA: Pain following fall

EXAM:
CT HEAD WITHOUT CONTRAST
CT CERVICAL SPINE WITHOUT CONTRAST
TECHNIQUE: Multidetector CT imaging of the head and cervical spine was
performed following the standard protocol without intravenous
contrast. Multiplanar CT image reconstructions of the cervical spine
were also generated.

[Series 4: head wo · axial · 0.43mm/px · z∈[+364,+504]mm · 3 of 29 slices shown, 4 images]
[im 1/29  brain]
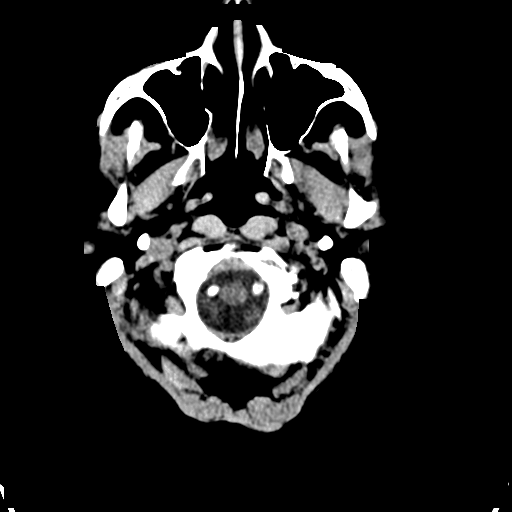
[im 1/29  bone]
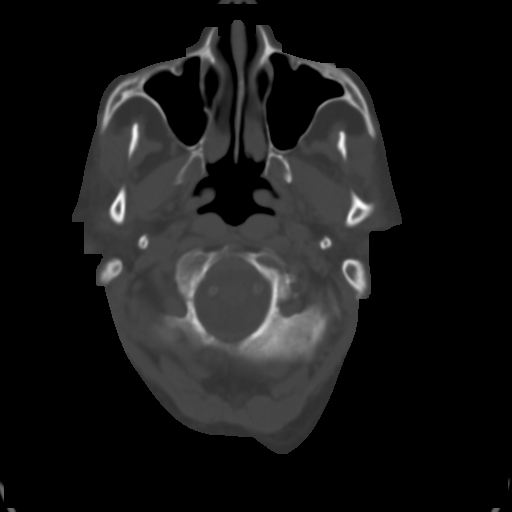
[im 15/29  brain]
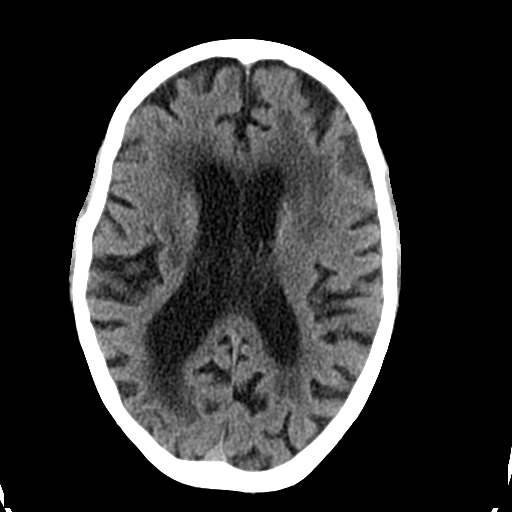
[im 29/29  brain]
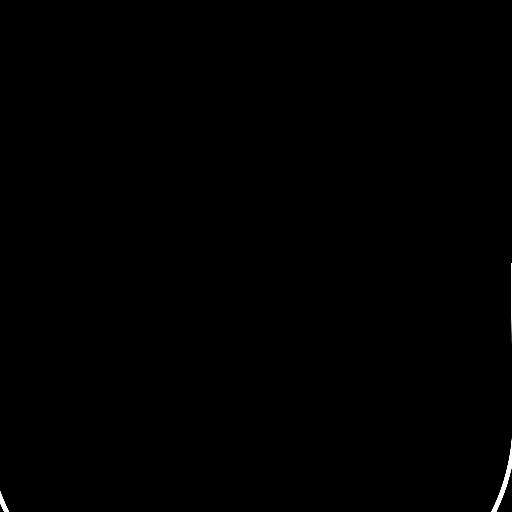

[Series 8: coronal soft tissue · coronal · 0.28mm/px · 3 of 70 slices shown]
[im 20/70  brain]
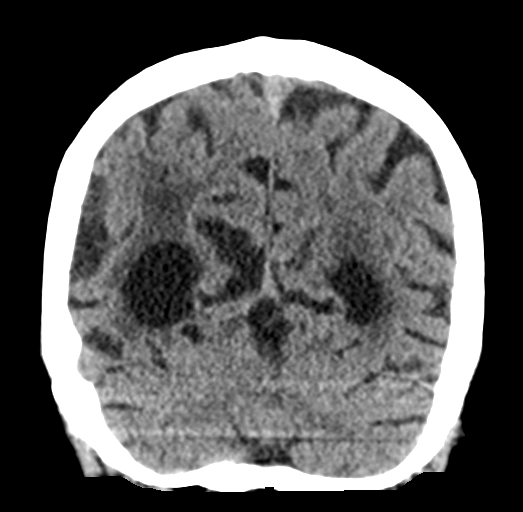
[im 30/70  brain]
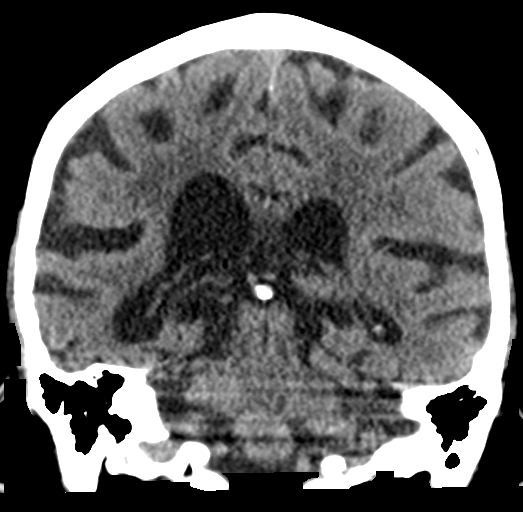
[im 40/70  brain]
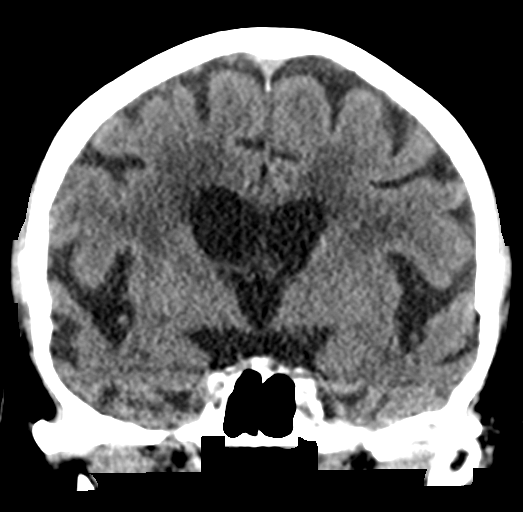

[Series 9: sagittal soft tissue · sagittal · 0.28mm/px · 1 of 50 slices shown]
[im 25/50  brain]
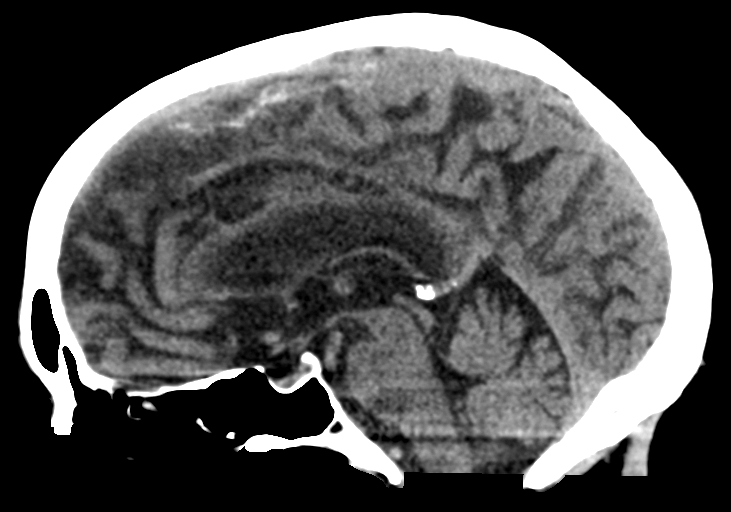

[Series 12: orthogonal bone · axial · 0.23mm/px · z∈[+153,+321]mm · 8 of 118 slices shown]
[im 12/118  bone]
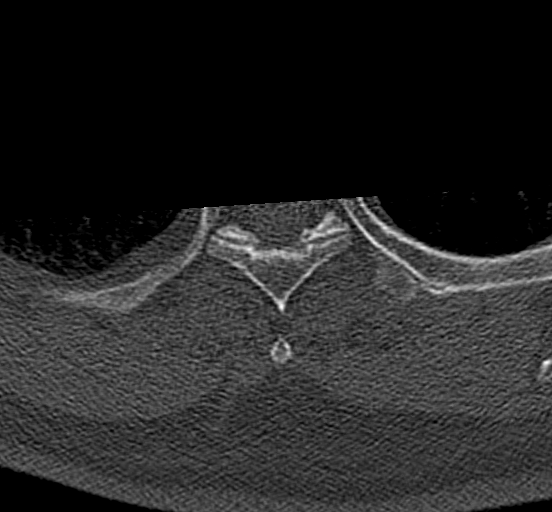
[im 24/118  bone]
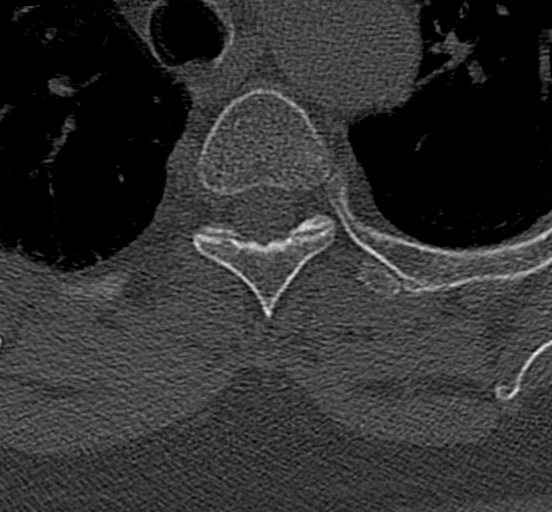
[im 36/118  bone]
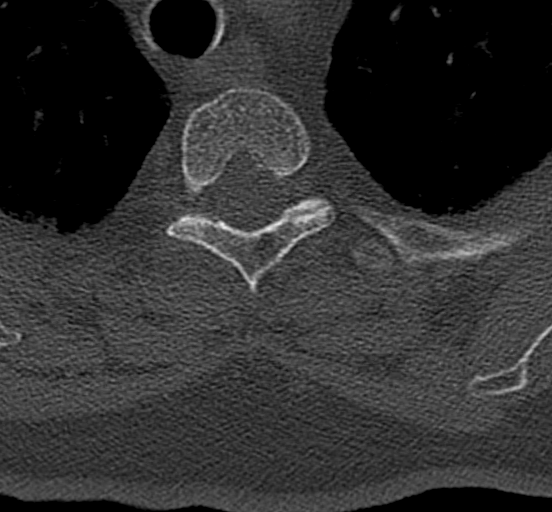
[im 47/118  bone]
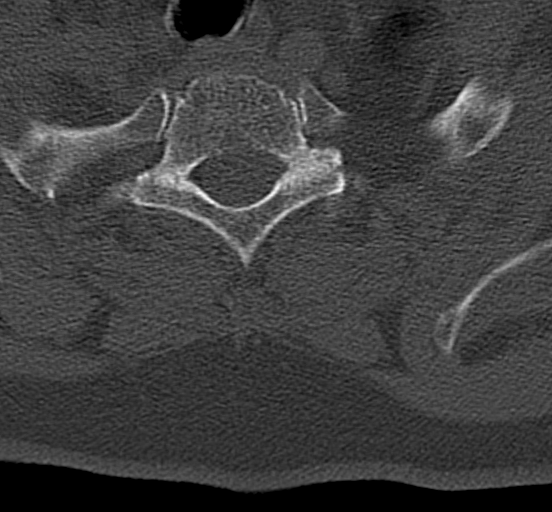
[im 71/118  bone]
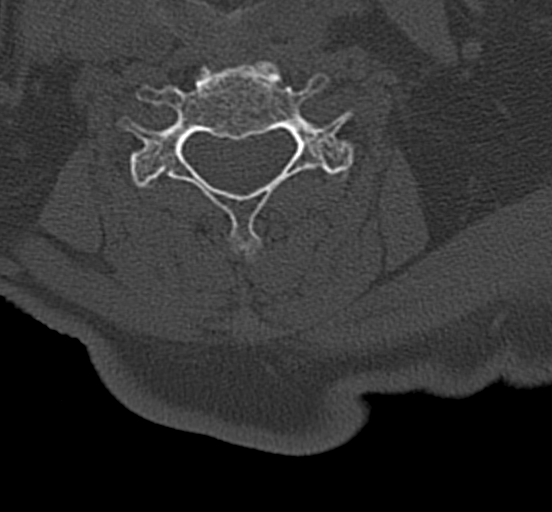
[im 82/118  bone]
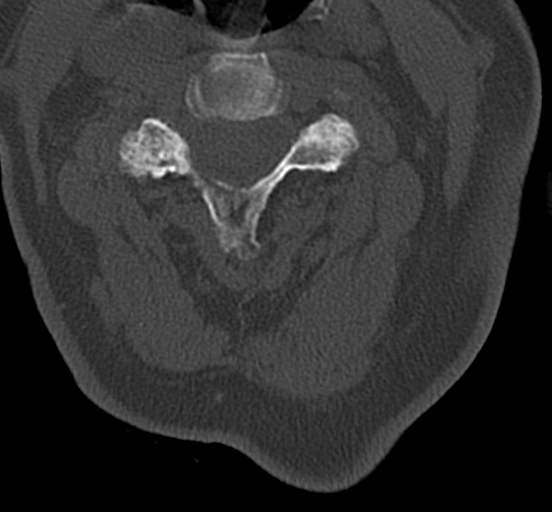
[im 94/118  bone]
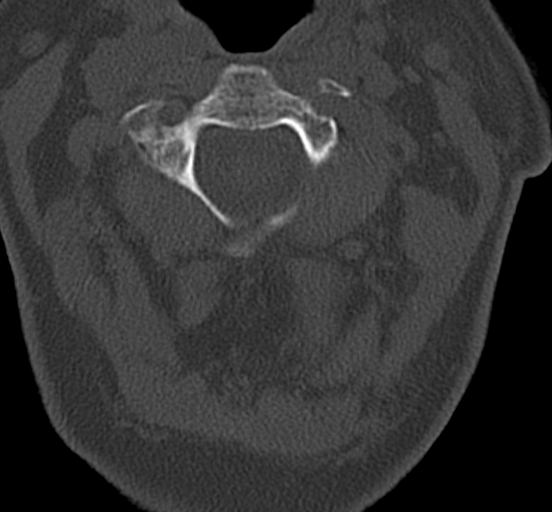
[im 106/118  bone]
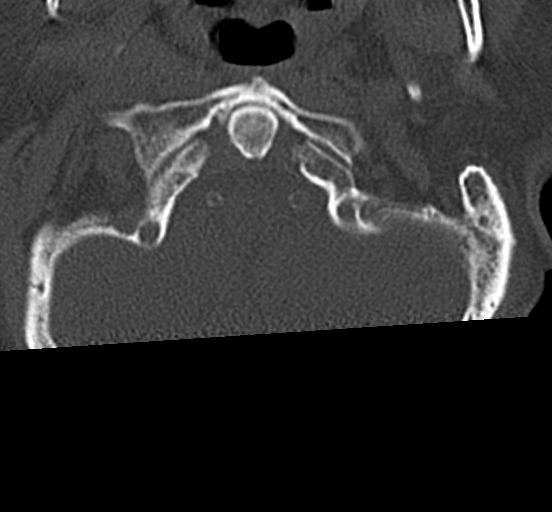

[15 of 47 positions shown; findings below may reference images not displayed]

FINDINGS: CT HEAD FINDINGS

Brain: There is stable moderate diffuse atrophy. Note that the
atrium of the right lateral ventricle is slightly larger than its
counterpart on the left, a stable finding of questionable etiology
and doubtful current clinical significance. There is no intracranial
mass, hemorrhage, extra-axial fluid collection, or midline shift.
There is small vessel disease throughout the centra semiovale
bilaterally, stable. There is a prior small infarct in the right
posterior lentiform nucleus, stable. There is no new gray-white
compartment lesion. No acute infarct is demonstrated.

Vascular: There is no evident hyperdense vessel. There is
calcification in each carotid siphon and distal vertebral artery.

Skull: Bony calvarium appears intact.

Sinuses/Orbits: There is mucosal thickening in several ethmoid air
cells bilaterally. Other visualized paranasal sinuses are clear.
Orbits appear symmetric bilaterally.

Other: Mastoid air cells are clear.

CT CERVICAL SPINE FINDINGS

Alignment: There is 2 mm of anterolisthesis of C4 on C5. No other
spondylolisthesis evident.

Skull base and vertebrae: Skull base and craniocervical junction
regions appear normal. There is no evident fracture. There are no
blastic or lytic bone lesions.

Soft tissues and spinal canal: Prevertebral soft tissues and
predental space regions are normal. There are no paraspinous
lesions. No evident cord or canal hematoma.

Disc levels: There is moderately severe disc space narrowing at C5-6
and C6-7. There is slight disc space narrowing at C4-5. There is
facet hypertrophy at several levels bilaterally. Exit foraminal
narrowing at C5-6 and C6-7 impresses upon the exiting nerve roots,
most severe at C5-6 bilaterally. No frank disc extrusion or stenosis
is evident on this study.

Upper chest: Visualized upper lung regions are clear.

Other:   There is carotid artery calcification bilaterally.
IMPRESSION: CT head: Atrophy with supratentorial small vessel disease. No
evident acute infarct. No mass or hemorrhage. There are foci of
arterial vascular calcification. There is mucosal thickening in
several ethmoid air cells.

CT cervical spine: No evident fracture. Slight spondylolisthesis at
C4-5, felt to be due to underlying spondylosis. Multilevel
osteoarthritic change noted, most severe at C5-6 where there is
significant impression on the exiting nerve roots bilaterally. No
evident disc extrusion.

There are foci of carotid artery calcification bilaterally.
